# Patient Record
Sex: Female | Born: 1970
Health system: Southern US, Community
[De-identification: ages and names within clinical notes are randomized; demographics above are authoritative.]

## PROBLEM LIST (undated history)

## (undated) DIAGNOSIS — E079 Disorder of thyroid, unspecified: Secondary | ICD-10-CM

## (undated) DIAGNOSIS — O24919 Unspecified diabetes mellitus in pregnancy, unspecified trimester: Secondary | ICD-10-CM

## (undated) DIAGNOSIS — IMO0002 Reserved for concepts with insufficient information to code with codable children: Secondary | ICD-10-CM

## (undated) DIAGNOSIS — E781 Pure hyperglyceridemia: Secondary | ICD-10-CM

## (undated) DIAGNOSIS — Z8742 Personal history of other diseases of the female genital tract: Secondary | ICD-10-CM

## (undated) DIAGNOSIS — E039 Hypothyroidism, unspecified: Secondary | ICD-10-CM

## (undated) DIAGNOSIS — Z794 Long term (current) use of insulin: Secondary | ICD-10-CM

## (undated) HISTORY — DX: Pure hyperglyceridemia: E78.1

## (undated) HISTORY — DX: Disorder of thyroid, unspecified: E07.9

---

## 2010-03-08 ENCOUNTER — Ambulatory Visit (HOSPITAL_COMMUNITY): Admission: RE | Admit: 2010-03-08 | Discharge: 2010-03-08 | Payer: Self-pay | Admitting: Obstetrics & Gynecology

## 2010-12-24 ENCOUNTER — Encounter: Payer: Self-pay | Admitting: Obstetrics

## 2011-12-04 NOTE — L&D Delivery Note (Signed)
Cesarean Section Procedure Note  Indications: previous uterine incision kerr x2  Pre-operative Diagnosis: 39 week 1 day pregnancy.  Post-operative Diagnosis: same  Surgeon: Genia Del   Assistants: Arlan Organ  Anesthesia: Spinal anesthesia  ASA Class: 1   Procedure Details   The patient was seen in the Holding Room. The risks, benefits, complications, treatment options, and expected outcomes were discussed with the patient.  The patient concurred with the proposed plan, giving informed consent.  The site of surgery properly noted/marked. The patient was taken to Operating Room # 1, identified as Mary Logan and the procedure verified as C-Section Delivery. A Time Out was held and the above information confirmed.  After induction of anesthesia, the patient was draped and prepped in the usual sterile manner. A Pfannenstiel incision was made and carried down through the subcutaneous tissue to the fascia. Fascial incision was made and extended transversely. The fascia was separated from the underlying rectus tissue superiorly and inferiorly. The peritoneum was identified and entered. Peritoneal incision was extended longitudinally. The utero-vesical peritoneal reflection was incised transversely and the bladder flap was bluntly freed from the lower uterine segment. A low transverse uterine incision was made. Delivered from cephalic presentation was a Female with Apgar scores of 9 at one minute and 9 at five minutes. After the umbilical cord was clamped and cut cord blood was obtained for evaluation. The placenta was removed intact and appeared normal. The uterine outline, tubes and ovaries appeared normal. The uterine incision was closed with running locked sutures of Vicryl-0, a second mattress suture was done with Vicryl-0.  Hemostasis was completed at the left angle with a figure of eight Vicryl-0.  Hemostasis was observed.  The parietal peritoneum was closed with a Vicryl 2-0.  The  fascia was then reapproximated with running sutures of Vicryl-0.  The adipose tissue was closed with a plain.  The skin was reapproximated with Staples.  Instrument, sponge, and needle counts were correct prior the abdominal closure and at the conclusion of the case.  Estimated Blood Loss:  500 cc           Specimens: Placenta         Complications:  None; patient tolerated the procedure well.         Disposition: PACU - hemodynamically stable.         Condition: stable  Attending Attestation: I was present and scrubbed for the entire procedure.   Genia Del MD  06/23/2012 at 7:16 pm

## 2011-12-17 ENCOUNTER — Encounter: Payer: BC Managed Care – PPO | Attending: Endocrinology | Admitting: Dietician

## 2011-12-17 ENCOUNTER — Encounter: Payer: Self-pay | Admitting: Dietician

## 2011-12-17 DIAGNOSIS — E119 Type 2 diabetes mellitus without complications: Secondary | ICD-10-CM | POA: Insufficient documentation

## 2011-12-17 DIAGNOSIS — Z713 Dietary counseling and surveillance: Secondary | ICD-10-CM | POA: Insufficient documentation

## 2011-12-17 NOTE — Progress Notes (Signed)
  Medical Nutrition Therapy:  Appt start time: 1630 end time:  1730.  Assessment:  Primary concerns today: Elevated blood glucose levels and pregnancy.  Currently [redacted] weeks pregnant and having hyperglycemia.  In addition, during pregnancy with the high glucose levels, she has a history of having hypertriglyceridemia at levels of 11,000-12,000 mg.  The glucose and triglycerides decrease to normal ranges in the non-pregnant state.  She has experienced this with two previous pregnancies.   She currently is starting to monitor.  She has a One Touch Mini meter that she had used all the test strips and had only obtained one blood glucose reading.  I provided her with a One Touch Ultra Kit, oriented her to the meter and had her do a return demonstration.  Her pre-meal glucose at 5:20 PM was 70 mg/dl.  She is to test the fasting and 1 hour after the meal for Dr. Talmage Nap and aim for a 1 hour blood glucose of 120 mg/dl or less.  The fasting is to be 90 mg/dl -60 mg/dl.   We reviewed carbohydrate counting.  I provided Estate manager/land agent and a mock menu with my GDM diet.  I recommended that she keep the meal carbohydrate level to 30 gm and the snack carbohydrate level at 15 gm.  She complains that her levels are running too high and that she most likely needs insulin to help get the glucose levels down.  I advised her we need numbers to base an insulin dose on.  MEDICATIONS: Metformin, Levothyroxine, Lovasa , prenatal vitamin  DIETARY INTAKE: On her 24 hour recall, she is attempting to keep her carbohydrate intake to 15 gm for each meal, and for snack will sometimes take a cup of milk which is 12 gm of carbohydrate.  This is limiting her calcium intake and even with the increased intake of protein, she is taking very little fruit if any, and this may lead to decreased potassium and other minerals.  Recent physical activity: Trying to walk daily for 20-30 minutes. Encouraged her to work-up to 30 minutes daily.  Estimated  energy needs:Would like to see her to be able to do 30 gm of CHO for meals and 15 gm for 3 snacks each day.  This would be approximately 1300 calories per day.  Progress Towards Goal(s):  In progress.   Nutritional Diagnosis:  [place]-2.1 Inpaired nutrition utilization As related to glucose and carbohydrate.  As evidenced by increased blood glucose levels with minimal amount of CHO intake and development of high lipid levels during pregnancy.    Intervention:  Nutrition Continue the current eating pattern and check your blood glucose levels.  Try the 30 gm of carb at the lunch and dinner meals.  Work with Dr. Talmage Nap regarding carb levels and the need for medications.  Handouts given during visit include:  Nutrition, Diabetes, and Pregnancy  Novo Nordisk Carbohydrate Counting Guide  Monitoring/Evaluation:  Dietary intake, exercise, blood glucose levels, and body weight in 4-6 weeks and is to call with questions or e-mail at anytime.

## 2011-12-19 ENCOUNTER — Other Ambulatory Visit (HOSPITAL_COMMUNITY): Payer: Self-pay | Admitting: Obstetrics & Gynecology

## 2011-12-19 DIAGNOSIS — Z3689 Encounter for other specified antenatal screening: Secondary | ICD-10-CM

## 2011-12-19 DIAGNOSIS — O24919 Unspecified diabetes mellitus in pregnancy, unspecified trimester: Secondary | ICD-10-CM

## 2011-12-19 DIAGNOSIS — O09529 Supervision of elderly multigravida, unspecified trimester: Secondary | ICD-10-CM

## 2011-12-19 LAB — OB RESULTS CONSOLE ABO/RH: RH Type: POSITIVE

## 2011-12-19 LAB — OB RESULTS CONSOLE HIV ANTIBODY (ROUTINE TESTING): HIV: NONREACTIVE

## 2011-12-19 LAB — OB RESULTS CONSOLE ANTIBODY SCREEN: Antibody Screen: NEGATIVE

## 2011-12-19 LAB — OB RESULTS CONSOLE HEPATITIS B SURFACE ANTIGEN: Hepatitis B Surface Ag: NEGATIVE

## 2011-12-19 LAB — OB RESULTS CONSOLE RUBELLA ANTIBODY, IGM: Rubella: IMMUNE

## 2012-01-25 ENCOUNTER — Encounter (HOSPITAL_COMMUNITY): Payer: BC Managed Care – PPO

## 2012-01-25 ENCOUNTER — Ambulatory Visit (HOSPITAL_COMMUNITY)
Admission: RE | Admit: 2012-01-25 | Discharge: 2012-01-25 | Disposition: A | Discharge status: Home / Self Care | Payer: BC Managed Care – PPO | Source: Ambulatory Visit | Attending: Obstetrics & Gynecology | Admitting: Obstetrics & Gynecology

## 2012-01-25 ENCOUNTER — Other Ambulatory Visit (HOSPITAL_COMMUNITY): Payer: BC Managed Care – PPO

## 2012-01-25 ENCOUNTER — Encounter (HOSPITAL_COMMUNITY): Payer: Self-pay

## 2012-01-25 DIAGNOSIS — O09529 Supervision of elderly multigravida, unspecified trimester: Secondary | ICD-10-CM | POA: Insufficient documentation

## 2012-01-25 DIAGNOSIS — Z3689 Encounter for other specified antenatal screening: Secondary | ICD-10-CM

## 2012-01-25 DIAGNOSIS — O358XX0 Maternal care for other (suspected) fetal abnormality and damage, not applicable or unspecified: Secondary | ICD-10-CM | POA: Insufficient documentation

## 2012-01-25 DIAGNOSIS — O09299 Supervision of pregnancy with other poor reproductive or obstetric history, unspecified trimester: Secondary | ICD-10-CM | POA: Insufficient documentation

## 2012-01-25 DIAGNOSIS — E079 Disorder of thyroid, unspecified: Secondary | ICD-10-CM | POA: Insufficient documentation

## 2012-01-25 DIAGNOSIS — O34219 Maternal care for unspecified type scar from previous cesarean delivery: Secondary | ICD-10-CM | POA: Insufficient documentation

## 2012-01-25 DIAGNOSIS — O24919 Unspecified diabetes mellitus in pregnancy, unspecified trimester: Secondary | ICD-10-CM

## 2012-01-25 DIAGNOSIS — Z8751 Personal history of pre-term labor: Secondary | ICD-10-CM | POA: Insufficient documentation

## 2012-01-25 NOTE — Progress Notes (Signed)
Mary Logan was seen for ultrasound appointment today.  Please see [facility]OB[facility] report for details.

## 2012-06-19 ENCOUNTER — Encounter (HOSPITAL_COMMUNITY): Payer: Self-pay | Admitting: Pharmacist

## 2012-06-20 ENCOUNTER — Encounter (HOSPITAL_COMMUNITY): Payer: Self-pay

## 2012-06-20 ENCOUNTER — Encounter (HOSPITAL_COMMUNITY)
Admission: RE | Admit: 2012-06-20 | Discharge: 2012-06-20 | Disposition: A | Discharge status: Home / Self Care | Payer: BC Managed Care – PPO | Source: Ambulatory Visit | Attending: Obstetrics & Gynecology | Admitting: Obstetrics & Gynecology

## 2012-06-20 HISTORY — DX: Hypothyroidism, unspecified: E03.9

## 2012-06-20 HISTORY — DX: Personal history of other diseases of the female genital tract: Z87.42

## 2012-06-20 LAB — TYPE AND SCREEN: ABO/RH(D): O POS

## 2012-06-20 LAB — CBC
HCT: 34 % — ABNORMAL LOW (ref 36.0–46.0)
MCH: 30.8 pg (ref 26.0–34.0)
MCHC: 34.4 g/dL (ref 30.0–36.0)
MCV: 89.5 fL (ref 78.0–100.0)
Platelets: 228 10*3/uL (ref 150–400)
RDW: 13.7 % (ref 11.5–15.5)
WBC: 6 10*3/uL (ref 4.0–10.5)

## 2012-06-20 LAB — SURGICAL PCR SCREEN: Staphylococcus aureus: POSITIVE — AB

## 2012-06-20 LAB — BASIC METABOLIC PANEL
Calcium: 9.4 mg/dL (ref 8.4–10.5)
Chloride: 101 mEq/L (ref 96–112)
Creatinine, Ser: 0.69 mg/dL (ref 0.50–1.10)
GFR calc Af Amer: >90 mL/min (ref >90)
Sodium: 131 mEq/L — ABNORMAL LOW (ref 135–145)

## 2012-06-20 LAB — ABO/RH: ABO/RH(D): O POS

## 2012-06-20 NOTE — Patient Instructions (Addendum)
   Your procedure is scheduled on:  Monday, July 22  Enter through the [employer] of [facility] at:  3 PM Pick up the phone at the desk and dial [phone] and inform us of your arrival.  Please call this number if you have any problems the morning of surgery: [phone]  Remember: Do not eat food after midnight: Sunday Do not drink clear liquids after: 1:30 PM Monday Take these medicines the morning of surgery with a SIP OF WATER:  Per Dr Cristela Blue, patient instructed to withhold  Sunday nigh Metformin dose,  Insulin on Sunday - take as you normally would (20-20-22 units), Monday morning, take 10 units of Humalog.  Then at 12 noon on Monday, patient to check her sugar level at home, if CBG is below 250 - no insulin to be given but if CBG is greater than 250 - patient to administer 10 units of Humalog.  Patient may also take prenatal vit and synthroid on Monday morning.  Do not wear jewelry, make-up, or FINGER nail polish No metal in your hair or on your body. Do not wear lotions, powders, perfumes or deodorant. Do not shave 48 hours prior to surgery. Do not bring valuables to the hospital. Contacts, dentures or bridgework may not be worn into surgery.  Leave suitcase in the car. After Surgery it may be brought to your room. For patients being admitted to the hospital, checkout time is 11:00am the day of discharge. Home with Husband Reza  cell [phone].  Patients discharged on the day of surgery will not be allowed to drive home.     Remember to use your hibiclens as instructed.Please shower with 1/2 bottle the evening before your surgery and the other 1/2 bottle the morning of surgery. Neck down avoiding private area.

## 2012-06-23 ENCOUNTER — Encounter (HOSPITAL_COMMUNITY): Payer: Self-pay | Admitting: *Deleted

## 2012-06-23 ENCOUNTER — Encounter (HOSPITAL_COMMUNITY): Payer: Self-pay

## 2012-06-23 ENCOUNTER — Inpatient Hospital Stay (HOSPITAL_COMMUNITY)
Admission: RE | Admit: 2012-06-23 | Discharge: 2012-06-26 | DRG: 370 | Disposition: A | Discharge status: Home / Self Care | Payer: BC Managed Care – PPO | Source: Ambulatory Visit | Attending: Obstetrics & Gynecology | Admitting: Obstetrics & Gynecology

## 2012-06-23 ENCOUNTER — Encounter (HOSPITAL_COMMUNITY)
Admission: RE | Disposition: A | Discharge status: Home / Self Care | Payer: Self-pay | Source: Ambulatory Visit | Attending: Obstetrics & Gynecology

## 2012-06-23 ENCOUNTER — Inpatient Hospital Stay (HOSPITAL_COMMUNITY): Payer: BC Managed Care – PPO

## 2012-06-23 ENCOUNTER — Other Ambulatory Visit: Payer: Self-pay | Admitting: Obstetrics & Gynecology

## 2012-06-23 DIAGNOSIS — Z01812 Encounter for preprocedural laboratory examination: Secondary | ICD-10-CM

## 2012-06-23 DIAGNOSIS — O99814 Abnormal glucose complicating childbirth: Secondary | ICD-10-CM | POA: Diagnosis present

## 2012-06-23 DIAGNOSIS — E039 Hypothyroidism, unspecified: Secondary | ICD-10-CM | POA: Diagnosis present

## 2012-06-23 DIAGNOSIS — Z01818 Encounter for other preprocedural examination: Secondary | ICD-10-CM

## 2012-06-23 DIAGNOSIS — O34219 Maternal care for unspecified type scar from previous cesarean delivery: Principal | ICD-10-CM | POA: Diagnosis present

## 2012-06-23 DIAGNOSIS — D649 Anemia, unspecified: Secondary | ICD-10-CM | POA: Diagnosis not present

## 2012-06-23 DIAGNOSIS — E079 Disorder of thyroid, unspecified: Secondary | ICD-10-CM | POA: Diagnosis present

## 2012-06-23 DIAGNOSIS — IMO0002 Reserved for concepts with insufficient information to code with codable children: Secondary | ICD-10-CM

## 2012-06-23 DIAGNOSIS — IMO0001 Reserved for inherently not codable concepts without codable children: Secondary | ICD-10-CM

## 2012-06-23 DIAGNOSIS — O09529 Supervision of elderly multigravida, unspecified trimester: Secondary | ICD-10-CM | POA: Diagnosis present

## 2012-06-23 DIAGNOSIS — O9903 Anemia complicating the puerperium: Secondary | ICD-10-CM | POA: Diagnosis not present

## 2012-06-23 HISTORY — DX: Unspecified diabetes mellitus in pregnancy, unspecified trimester: O24.919

## 2012-06-23 HISTORY — DX: Long term (current) use of insulin: Z79.4

## 2012-06-23 HISTORY — DX: Reserved for concepts with insufficient information to code with codable children: IMO0002

## 2012-06-23 LAB — TYPE AND SCREEN: Antibody Screen: NEGATIVE

## 2012-06-23 LAB — CBC
HCT: 32 % — ABNORMAL LOW (ref 36.0–46.0)
MCH: 30.9 pg (ref 26.0–34.0)
MCV: 89.9 fL (ref 78.0–100.0)
Platelets: 216 10*3/uL (ref 150–400)
RBC: 3.56 MIL/uL — ABNORMAL LOW (ref 3.87–5.11)
RDW: 13.5 % (ref 11.5–15.5)
WBC: 5.5 10*3/uL (ref 4.0–10.5)

## 2012-06-23 LAB — GLUCOSE, CAPILLARY: Glucose-Capillary: 73 mg/dL (ref 70–99)

## 2012-06-23 SURGERY — Surgical Case
Anesthesia: Spinal | Site: Abdomen | Wound class: Clean Contaminated

## 2012-06-23 MED ORDER — KETOROLAC TROMETHAMINE 60 MG/2ML IM SOLN
INTRAMUSCULAR | Status: AC
  Administered 2012-06-23: 60 mg via INTRAMUSCULAR
  Filled 2012-06-23: qty 2

## 2012-06-23 MED ORDER — SIMETHICONE 80 MG PO CHEW: 80.0000 mg | CHEWABLE_TABLET | ORAL | Status: DC | PRN

## 2012-06-23 MED ORDER — DIPHENHYDRAMINE HCL 25 MG PO CAPS: 25.0000 mg | ORAL_CAPSULE | Freq: Four times a day (QID) | ORAL | Status: DC | PRN

## 2012-06-23 MED ORDER — LANOLIN HYDROUS EX OINT: 1.0000 "application " | TOPICAL_OINTMENT | CUTANEOUS | Status: DC | PRN

## 2012-06-23 MED ORDER — ONDANSETRON HCL 4 MG/2ML IJ SOLN
INTRAMUSCULAR | Status: AC
  Filled 2012-06-23: qty 2

## 2012-06-23 MED ORDER — PHENYLEPHRINE 40 MCG/ML (10ML) SYRINGE FOR IV PUSH (FOR BLOOD PRESSURE SUPPORT)
PREFILLED_SYRINGE | INTRAVENOUS | Status: AC
  Filled 2012-06-23: qty 10

## 2012-06-23 MED ORDER — SCOPOLAMINE 1 MG/3DAYS TD PT72: 1.0000 | MEDICATED_PATCH | Freq: Once | TRANSDERMAL | Status: DC

## 2012-06-23 MED ORDER — ONDANSETRON HCL 4 MG PO TABS: 4.0000 mg | ORAL_TABLET | ORAL | Status: DC | PRN

## 2012-06-23 MED ORDER — OXYTOCIN 10 UNIT/ML IJ SOLN
INTRAMUSCULAR | Status: AC
  Filled 2012-06-23: qty 4

## 2012-06-23 MED ORDER — SENNOSIDES-DOCUSATE SODIUM 8.6-50 MG PO TABS
2.0000 | ORAL_TABLET | Freq: Every day | ORAL | Status: DC
  Administered 2012-06-24 – 2012-06-25 (×2): 2 via ORAL

## 2012-06-23 MED ORDER — SCOPOLAMINE 1 MG/3DAYS TD PT72
MEDICATED_PATCH | TRANSDERMAL | Status: AC
  Administered 2012-06-23: 1.5 mg via TRANSDERMAL
  Filled 2012-06-23: qty 1

## 2012-06-23 MED ORDER — FENTANYL CITRATE 0.05 MG/ML IJ SOLN: 25.0000 ug | INTRAMUSCULAR | Status: DC | PRN

## 2012-06-23 MED ORDER — LEVOTHYROXINE SODIUM 112 MCG PO TABS
112.0000 ug | ORAL_TABLET | Freq: Every day | ORAL | Status: DC
  Administered 2012-06-24 – 2012-06-26 (×3): 112 ug via ORAL
  Filled 2012-06-23 (×3): qty 1

## 2012-06-23 MED ORDER — KETOROLAC TROMETHAMINE 30 MG/ML IJ SOLN: 30.0000 mg | Freq: Four times a day (QID) | INTRAMUSCULAR | Status: AC | PRN

## 2012-06-23 MED ORDER — ONDANSETRON HCL 4 MG/2ML IJ SOLN: 4.0000 mg | Freq: Three times a day (TID) | INTRAMUSCULAR | Status: DC | PRN

## 2012-06-23 MED ORDER — OXYCODONE-ACETAMINOPHEN 5-325 MG PO TABS
1.0000 | ORAL_TABLET | ORAL | Status: DC | PRN
  Administered 2012-06-25 – 2012-06-26 (×3): 1 via ORAL
  Filled 2012-06-23 (×3): qty 1

## 2012-06-23 MED ORDER — OXYTOCIN 40 UNITS IN LACTATED RINGERS INFUSION - SIMPLE MED: 62.5000 mL/h | INTRAVENOUS | Status: AC

## 2012-06-23 MED ORDER — IBUPROFEN 600 MG PO TABS
600.0000 mg | ORAL_TABLET | Freq: Four times a day (QID) | ORAL | Status: DC
  Administered 2012-06-24 – 2012-06-26 (×9): 600 mg via ORAL
  Filled 2012-06-23 (×9): qty 1

## 2012-06-23 MED ORDER — BUPIVACAINE HCL (PF) 0.25 % IJ SOLN
INTRAMUSCULAR | Status: DC | PRN
  Administered 2012-06-23: 30 mL

## 2012-06-23 MED ORDER — IBUPROFEN 600 MG PO TABS: 600.0000 mg | ORAL_TABLET | Freq: Four times a day (QID) | ORAL | Status: DC | PRN

## 2012-06-23 MED ORDER — SODIUM CHLORIDE 0.9 % IV SOLN: 1.0000 ug/kg/h | INTRAVENOUS | Status: DC | PRN

## 2012-06-23 MED ORDER — ONDANSETRON HCL 4 MG/2ML IJ SOLN
INTRAMUSCULAR | Status: DC | PRN
  Administered 2012-06-23: 4 mg via INTRAVENOUS

## 2012-06-23 MED ORDER — NALOXONE HCL 0.4 MG/ML IJ SOLN: 0.4000 mg | INTRAMUSCULAR | Status: DC | PRN

## 2012-06-23 MED ORDER — PHENYLEPHRINE HCL 10 MG/ML IJ SOLN
INTRAMUSCULAR | Status: DC | PRN
  Administered 2012-06-23 (×2): 80 ug via INTRAVENOUS
  Administered 2012-06-23 (×2): 40 ug via INTRAVENOUS

## 2012-06-23 MED ORDER — KETOROLAC TROMETHAMINE 30 MG/ML IJ SOLN
30.0000 mg | Freq: Four times a day (QID) | INTRAMUSCULAR | Status: AC | PRN
  Filled 2012-06-23: qty 1

## 2012-06-23 MED ORDER — DEXTROSE 5 % IV SOLN
2.0000 g | Freq: Once | INTRAVENOUS | Status: AC
  Administered 2012-06-23: 2 g via INTRAVENOUS
  Filled 2012-06-23: qty 2

## 2012-06-23 MED ORDER — ZOLPIDEM TARTRATE 5 MG PO TABS: 5.0000 mg | ORAL_TABLET | Freq: Every evening | ORAL | Status: DC | PRN

## 2012-06-23 MED ORDER — OMEGA-3-ACID ETHYL ESTERS 1 G PO CAPS
3.0000 g | ORAL_CAPSULE | Freq: Two times a day (BID) | ORAL | Status: DC
  Administered 2012-06-24: 3 g via ORAL
  Administered 2012-06-25 – 2012-06-26 (×3): 1 g via ORAL
  Filled 2012-06-23 (×6): qty 3

## 2012-06-23 MED ORDER — INSULIN ASPART 100 UNIT/ML ~~LOC~~ SOLN
11.0000 [IU] | Freq: Every day | SUBCUTANEOUS | Status: DC
  Administered 2012-06-24 – 2012-06-25 (×2): 11 [IU] via SUBCUTANEOUS

## 2012-06-23 MED ORDER — SODIUM CHLORIDE 0.9 % IJ SOLN: 3.0000 mL | INTRAMUSCULAR | Status: DC | PRN

## 2012-06-23 MED ORDER — BUPIVACAINE IN DEXTROSE 0.75-8.25 % IT SOLN
INTRATHECAL | Status: DC | PRN
  Administered 2012-06-23: 1.3 mL via INTRATHECAL

## 2012-06-23 MED ORDER — LACTATED RINGERS IV SOLN
INTRAVENOUS | Status: DC
  Administered 2012-06-23 (×2): via INTRAVENOUS
  Administered 2012-06-23: 125 mL/h via INTRAVENOUS
  Administered 2012-06-23: 18:00:00 via INTRAVENOUS

## 2012-06-23 MED ORDER — MENTHOL 3 MG MT LOZG: 1.0000 | LOZENGE | OROMUCOSAL | Status: DC | PRN

## 2012-06-23 MED ORDER — OXYTOCIN 10 UNIT/ML IJ SOLN
40.0000 [IU] | INTRAVENOUS | Status: DC | PRN
  Administered 2012-06-23: 40 [IU] via INTRAVENOUS

## 2012-06-23 MED ORDER — LACTATED RINGERS IV SOLN
INTRAVENOUS | Status: DC
  Administered 2012-06-24: 06:00:00 via INTRAVENOUS

## 2012-06-23 MED ORDER — MUPIROCIN 2 % EX OINT
TOPICAL_OINTMENT | CUTANEOUS | Status: AC
  Filled 2012-06-23: qty 22

## 2012-06-23 MED ORDER — FENTANYL CITRATE 0.05 MG/ML IJ SOLN
INTRAMUSCULAR | Status: DC | PRN
  Administered 2012-06-23: 15 ug via INTRATHECAL

## 2012-06-23 MED ORDER — BUPIVACAINE HCL (PF) 0.25 % IJ SOLN
INTRAMUSCULAR | Status: AC
  Filled 2012-06-23: qty 30

## 2012-06-23 MED ORDER — MORPHINE SULFATE 0.5 MG/ML IJ SOLN
INTRAMUSCULAR | Status: AC
  Filled 2012-06-23: qty 10

## 2012-06-23 MED ORDER — DIPHENHYDRAMINE HCL 25 MG PO CAPS
25.0000 mg | ORAL_CAPSULE | ORAL | Status: DC | PRN
Start: 2012-06-23 — End: 2012-06-26

## 2012-06-23 MED ORDER — MAGNESIUM HYDROXIDE 400 MG/5ML PO SUSP: 30.0000 mL | ORAL | Status: DC | PRN

## 2012-06-23 MED ORDER — DIBUCAINE 1 % RE OINT: 1.0000 "application " | TOPICAL_OINTMENT | RECTAL | Status: DC | PRN

## 2012-06-23 MED ORDER — DIPHENHYDRAMINE HCL 50 MG/ML IJ SOLN: 25.0000 mg | INTRAMUSCULAR | Status: DC | PRN

## 2012-06-23 MED ORDER — MORPHINE SULFATE (PF) 0.5 MG/ML IJ SOLN
INTRAMUSCULAR | Status: DC | PRN
  Administered 2012-06-23: .1 mg via INTRATHECAL

## 2012-06-23 MED ORDER — FERROUS SULFATE 325 (65 FE) MG PO TABS
325.0000 mg | ORAL_TABLET | Freq: Two times a day (BID) | ORAL | Status: DC
  Administered 2012-06-24 – 2012-06-26 (×5): 325 mg via ORAL
  Filled 2012-06-23 (×5): qty 1

## 2012-06-23 MED ORDER — FENTANYL CITRATE 0.05 MG/ML IJ SOLN
INTRAMUSCULAR | Status: AC
  Filled 2012-06-23: qty 2

## 2012-06-23 MED ORDER — NALBUPHINE HCL 10 MG/ML IJ SOLN
5.0000 mg | INTRAMUSCULAR | Status: DC | PRN
  Administered 2012-06-24: 5 mg via INTRAVENOUS
  Filled 2012-06-23: qty 1

## 2012-06-23 MED ORDER — WITCH HAZEL-GLYCERIN EX PADS: 1.0000 "application " | MEDICATED_PAD | CUTANEOUS | Status: DC | PRN

## 2012-06-23 MED ORDER — MEPERIDINE HCL 25 MG/ML IJ SOLN: 6.2500 mg | INTRAMUSCULAR | Status: DC | PRN

## 2012-06-23 MED ORDER — METOCLOPRAMIDE HCL 5 MG/ML IJ SOLN: 10.0000 mg | Freq: Three times a day (TID) | INTRAMUSCULAR | Status: DC | PRN

## 2012-06-23 MED ORDER — INSULIN ASPART 100 UNIT/ML ~~LOC~~ SOLN
10.0000 [IU] | Freq: Every day | SUBCUTANEOUS | Status: DC
  Administered 2012-06-24 – 2012-06-25 (×2): 10 [IU] via SUBCUTANEOUS

## 2012-06-23 MED ORDER — PRENATAL MULTIVITAMIN CH
1.0000 | ORAL_TABLET | Freq: Every day | ORAL | Status: DC
  Administered 2012-06-24 – 2012-06-26 (×3): 1 via ORAL
  Filled 2012-06-23 (×3): qty 1

## 2012-06-23 MED ORDER — KETOROLAC TROMETHAMINE 60 MG/2ML IM SOLN
60.0000 mg | Freq: Once | INTRAMUSCULAR | Status: AC | PRN
  Administered 2012-06-23: 60 mg via INTRAMUSCULAR

## 2012-06-23 MED ORDER — TETANUS-DIPHTH-ACELL PERTUSSIS 5-2.5-18.5 LF-MCG/0.5 IM SUSP: 0.5000 mL | Freq: Once | INTRAMUSCULAR | Status: DC

## 2012-06-23 MED ORDER — NALBUPHINE HCL 10 MG/ML IJ SOLN
5.0000 mg | INTRAMUSCULAR | Status: DC | PRN
  Administered 2012-06-24: 5 mg via SUBCUTANEOUS
  Filled 2012-06-23 (×2): qty 1

## 2012-06-23 MED ORDER — DIPHENHYDRAMINE HCL 50 MG/ML IJ SOLN: 12.5000 mg | INTRAMUSCULAR | Status: DC | PRN

## 2012-06-23 MED ORDER — SCOPOLAMINE 1 MG/3DAYS TD PT72
1.0000 | MEDICATED_PATCH | Freq: Once | TRANSDERMAL | Status: DC
  Administered 2012-06-23: 1.5 mg via TRANSDERMAL

## 2012-06-23 MED ORDER — ONDANSETRON HCL 4 MG/2ML IJ SOLN: 4.0000 mg | INTRAMUSCULAR | Status: DC | PRN

## 2012-06-23 MED ORDER — INSULIN ASPART 100 UNIT/ML ~~LOC~~ SOLN
10.0000 [IU] | Freq: Every day | SUBCUTANEOUS | Status: DC
  Administered 2012-06-24 – 2012-06-26 (×3): 10 [IU] via SUBCUTANEOUS

## 2012-06-23 MED ORDER — SIMETHICONE 80 MG PO CHEW
80.0000 mg | CHEWABLE_TABLET | Freq: Three times a day (TID) | ORAL | Status: DC
  Administered 2012-06-24 – 2012-06-26 (×8): 80 mg via ORAL

## 2012-06-23 SURGICAL SUPPLY — 33 items
CLOTH BEACON ORANGE TIMEOUT ST (SAFETY) ×2 IMPLANT
CONTAINER PREFILL 10% NBF 15ML (MISCELLANEOUS) IMPLANT
DRSG COVADERM 4X10 (GAUZE/BANDAGES/DRESSINGS) ×2 IMPLANT
ELECT REM PT RETURN 9FT ADLT (ELECTROSURGICAL) ×2
ELECTRODE REM PT RTRN 9FT ADLT (ELECTROSURGICAL) ×1 IMPLANT
EXTRACTOR VACUUM M CUP 4 TUBE (SUCTIONS) IMPLANT
GLOVE BIO SURGEON STRL SZ 6.5 (GLOVE) ×4 IMPLANT
GLOVE BIOGEL PI IND STRL 7.0 (GLOVE) ×1 IMPLANT
GLOVE BIOGEL PI INDICATOR 7.0 (GLOVE) ×1
GOWN PREVENTION PLUS LG XLONG (DISPOSABLE) ×6 IMPLANT
KIT ABG SYR 3ML LUER SLIP (SYRINGE) IMPLANT
NEEDLE HYPO 22GX1.5 SAFETY (NEEDLE) ×2 IMPLANT
NEEDLE HYPO 25X1 1.5 SAFETY (NEEDLE) ×2 IMPLANT
NEEDLE HYPO 25X5/8 SAFETYGLIDE (NEEDLE) ×2 IMPLANT
PACK C SECTION WH (CUSTOM PROCEDURE TRAY) ×2 IMPLANT
PAD ABD 7.5X8 STRL (GAUZE/BANDAGES/DRESSINGS) ×2 IMPLANT
RTRCTR C-SECT PINK 25CM LRG (MISCELLANEOUS) ×2 IMPLANT
SLEEVE SCD COMPRESS KNEE MED (MISCELLANEOUS) ×2 IMPLANT
STAPLER VISISTAT 35W (STAPLE) IMPLANT
SUT PLAIN 0 NONE (SUTURE) IMPLANT
SUT VIC AB 0 CT1 27 (SUTURE) ×2
SUT VIC AB 0 CT1 27XBRD ANBCTR (SUTURE) ×2 IMPLANT
SUT VIC AB 0 CTX 36 (SUTURE) ×2
SUT VIC AB 0 CTX36XBRD ANBCTRL (SUTURE) ×2 IMPLANT
SUT VIC AB 2-0 CT1 27 (SUTURE) ×1
SUT VIC AB 2-0 CT1 TAPERPNT 27 (SUTURE) ×1 IMPLANT
SUT VIC AB 3-0 SH 27 (SUTURE)
SUT VIC AB 3-0 SH 27X BRD (SUTURE) IMPLANT
SYR CONTROL 10ML LL (SYRINGE) ×4 IMPLANT
TAPE CLOTH SURG 4X10 WHT LF (GAUZE/BANDAGES/DRESSINGS) ×2 IMPLANT
TOWEL OR 17X24 6PK STRL BLUE (TOWEL DISPOSABLE) ×4 IMPLANT
TRAY FOLEY CATH 14FR (SET/KITS/TRAYS/PACK) ×2 IMPLANT
WATER STERILE IRR 1000ML POUR (IV SOLUTION) IMPLANT

## 2012-06-23 NOTE — Anesthesia Preprocedure Evaluation (Signed)
Anesthesia Evaluation  Patient identified by MRN, date of birth, ID band Patient awake    Reviewed: Allergy & Precautions, H&P , NPO status , Patient's Chart, lab work & pertinent test results, reviewed documented beta blocker date and time   History of Anesthesia Complications Negative for: history of anesthetic complications  Airway Mallampati: III TM Distance: >3 FB Neck ROM: full    Dental  (+) Caps and Chipped   Pulmonary neg pulmonary ROS,  breath sounds clear to auscultation        Cardiovascular negative cardio ROS  Rhythm:regular Rate:Normal     Neuro/Psych negative neurological ROS  negative psych ROS   GI/Hepatic negative GI ROS, Neg liver ROS,   Endo/Other  Gestational, Insulin DependentHypothyroidism PCOS  Renal/GU negative Renal ROS  negative genitourinary   Musculoskeletal   Abdominal   Peds  Hematology negative hematology ROS (+)   Anesthesia Other Findings   Reproductive/Obstetrics (+) Pregnancy (h/o prior c/s x2)                           Anesthesia Physical Anesthesia Plan  ASA: II  Anesthesia Plan: Spinal   Post-op Pain Management:    Induction:   Airway Management Planned:   Additional Equipment:   Intra-op Plan:   Post-operative Plan:   Informed Consent: I have reviewed the patients History and Physical, chart, labs and discussed the procedure including the risks, benefits and alternatives for the proposed anesthesia with the patient or authorized representative who has indicated his/her understanding and acceptance.     Plan Discussed with: Surgeon and CRNA  Anesthesia Plan Comments:         Anesthesia Quick Evaluation

## 2012-06-23 NOTE — Anesthesia Postprocedure Evaluation (Signed)
  Anesthesia Post-op Note  Patient: Mary Logan  Procedure(s) Performed: Procedure(s) (LRB): CESAREAN SECTION (N/A)  Patient Location: PACU  Anesthesia Type: Spinal  Level of Consciousness: awake, alert  and oriented  Airway and Oxygen Therapy: Patient Spontanous Breathing  Post-op Pain: none  Post-op Assessment: Post-op Vital signs reviewed, Patient's Cardiovascular Status Stable, Respiratory Function Stable, Patent Airway, No signs of Nausea or vomiting, Pain level controlled, No headache, No backache, No residual numbness and No residual motor weakness  Post-op Vital Signs: Reviewed and stable  Complications: No apparent anesthesia complications

## 2012-06-23 NOTE — H&P (Signed)
Mary Logan is a 41 y.o. female [id number] [redacted]w[redacted]d presenting for repeat C/S.  HPP:  IDDM well controled.  AGA per [place].  OB History    Grav Para Term Preterm Abortions TAB SAB Ect Mult Living   5 2 1 1 2  2   1      Past Medical History  Diagnosis Date  . Thyroid disease   . Hypertriglyceridemia     diet controlled  . Diabetes mellitus     GDM - insulin  . Hypothyroidism   . History of PCOS     tx with metformin   Past Surgical History  Procedure Date  . Cesarean section     x 2   Family History: family history includes Diabetes in her brother and mother. Social History:  reports that she has never smoked. She has never used smokeless tobacco. She reports that she does not drink alcohol or use illicit drugs. Current facility-administered medications:bupivacaine (MARCAINE) 0.25 % injection, , , PRN, Genia Del, MD, 30 mL at 06/23/12 1725;  lactated ringers infusion, , Intravenous, Continuous, Dana Allan, MD, Last Rate: 125 mL/hr at 06/23/12 1645, 125 mL/hr at 06/23/12 1645;  mupirocin ointment (BACTROBAN) 2 %, , , , ;  scopolamine (TRANSDERM-SCOP) 1.5 MG 1.5 mg, 1 patch, Transdermal, Once, Dana Allan, MD, 1.5 mg at 06/23/12 1602 No Known Allergies    Blood pressure 127/75, pulse 89, temperature 98.2 F (36.8 C), temperature source Oral, resp. rate 18, last menstrual period 09/17/2011, SpO2 100.00%.   HPP: There is no problem list on file for this patient.   Prenatal labs: ABO, Rh: --/--/O POS (07/19 1420) Antibody: NEG (07/19 1413) Rubella:  Immune RPR: NON REACTIVE (07/19 1413)  HBsAg: Negative (01/16 0000)  HIV: Non-reactive (01/16 0000)  Genetic testing: Declined [place] anato: wnl level 2 GBS:  neg  Assessment/Plan: 39+ wks IDDM well controled.  2 previous C/S for repeat C/S.   Mary Logan,Mary Logan 06/23/2012, 5:27 PM

## 2012-06-23 NOTE — Anesthesia Procedure Notes (Signed)
Spinal  Patient location during procedure: OR Start time: 06/23/2012 6:08 PM Staffing Performed by: anesthesiologist  Preanesthetic Checklist Completed: patient identified, site marked, surgical consent, pre-op evaluation, timeout performed, IV checked, risks and benefits discussed and monitors and equipment checked Spinal Block Patient position: sitting Prep: site prepped and draped and DuraPrep Patient monitoring: blood pressure, continuous pulse ox and heart rate Approach: midline Location: L3-4 Injection technique: single-shot Needle Needle type: Sprotte  Needle gauge: 24 G Needle length: 9 cm Assessment Sensory level: T4 Additional Notes Clear free flow CSF on first attempt.  No paresthesia.  Patient tolerated procedure well.  Jasmine December, MD

## 2012-06-23 NOTE — Transfer of Care (Signed)
Immediate Anesthesia Transfer of Care Note  Patient: Mary Logan  Procedure(s) Performed: Procedure(s) (LRB): CESAREAN SECTION (N/A)  Patient Location: PACU  Anesthesia Type: Spinal  Level of Consciousness: awake, alert  and oriented  Airway & Oxygen Therapy: Patient Spontanous Breathing  Post-op Assessment: Report given to PACU RN  Post vital signs: Reviewed and stable  Complications: No apparent anesthesia complications

## 2012-06-23 NOTE — Progress Notes (Signed)
CBG 63 in SS

## 2012-06-24 ENCOUNTER — Encounter (HOSPITAL_COMMUNITY): Payer: Self-pay

## 2012-06-24 DIAGNOSIS — IMO0002 Reserved for concepts with insufficient information to code with codable children: Secondary | ICD-10-CM

## 2012-06-24 DIAGNOSIS — IMO0001 Reserved for inherently not codable concepts without codable children: Secondary | ICD-10-CM

## 2012-06-24 HISTORY — DX: Reserved for inherently not codable concepts without codable children: IMO0001

## 2012-06-24 HISTORY — DX: Reserved for concepts with insufficient information to code with codable children: IMO0002

## 2012-06-24 LAB — CBC
HCT: 26.6 % — ABNORMAL LOW (ref 36.0–46.0)
MCH: 31.1 pg (ref 26.0–34.0)
MCHC: 34.6 g/dL (ref 30.0–36.0)
MCV: 89.9 fL (ref 78.0–100.0)
RDW: 13.6 % (ref 11.5–15.5)

## 2012-06-24 MED ORDER — LACTATED RINGERS IV BOLUS (SEPSIS)
500.0000 mL | Freq: Once | INTRAVENOUS | Status: AC
  Administered 2012-06-24: 500 mL via INTRAVENOUS

## 2012-06-24 NOTE — Anesthesia Postprocedure Evaluation (Signed)
  Anesthesia Post-op Note  Patient: Mary Logan  Procedure(s) Performed: Procedure(s) (LRB): CESAREAN SECTION (N/A)  Patient Location: Mother/Baby  Anesthesia Type: Spinal  Level of Consciousness: awake, alert  and oriented  Airway and Oxygen Therapy: Patient Spontanous Breathing  Post-op Pain: none  Post-op Assessment: Post-op Vital signs reviewed and Patient's Cardiovascular Status Stable  Post-op Vital Signs: Reviewed and stable  Complications: No apparent anesthesia complications

## 2012-06-24 NOTE — Progress Notes (Signed)
Subjective: POD# 1 Information for the patient's newborn:  Nevin, Kozuch Girl Mylah [[id number]]  female   Reports feeling tired but well. Feeding: breast Patient reports tolerating PO.  Breast symptoms: none Pain controlled with Motrin. Denies HA/SOB/C/P/N/V/dizziness. Flatus small. She reports vaginal bleeding as normal, without clots.  She ambulated to BR x 1, foley cath in place.     Objective:   VS:  Filed Vitals:   06/24/12 0550 06/24/12 0630 06/24/12 0633 06/24/12 0636  BP:  99/65 109/75 118/67  Pulse:  66 81 73  Temp:  97.8 F (36.6 C)    TempSrc:  Oral    Resp: 16 18    Weight:      SpO2: 97% 98%       Intake/Output Summary (Last 24 hours) at 06/24/12 0858 Last data filed at 06/24/12 0704  Gross per 24 hour  Intake   3545 ml  Output   1350 ml  Net   2195 ml        Basename 06/24/12 0550 06/23/12 1618  WBC 8.4 5.5  HGB 9.2* 11.0*  HCT 26.6* 32.0*  PLT 167 216   CBG (last 3)   Basename 06/24/12 0820 06/23/12 1946 06/23/12 1605  GLUCAP 83 73 63*       Blood type: --/--/O POS (07/22 1618)  Rubella: Immune (01/16 0000)     Physical Exam:  General: alert, cooperative and no distress CV: Regular rate and rhythm Resp: clear Abdomen: soft, nontender, decreased bowel sounds Incision: clean, dry, intact and dressing to LST Uterine Fundus: firm, below umbilicus, nontender Lochia: minimal Ext: Homans sign is negative, no sign of DVT and no edema, redness or tenderness in the calves or thighs      Assessment/Plan: 41 y.o.   POD# 1.  s/p Cesarean Delivery.  Indications: repeat                Principal Problem:  *Postpartum care following cesarean delivery (7/22) Active Problems:  Maternal anemia complicating pregnancy, childbirth, or the puerperium  Pregnancy And Insulin-Dependent Diabetes Mellitus (delivered)  Doing well, stable post-op.    Advance diet as tolerated D/C foley  Ambulate Routine post-op care  CBG stable on half previous dose of  insulin. Plan f/u w/ endo (Dr. Talmage Nap) post-partum  Asymptomatic IDA and ABL anemia, on oral Fe             Davisha Linthicum 06/24/2012, 8:58 AM

## 2012-06-24 NOTE — Progress Notes (Signed)
MD notified of decreased urinary output.  100 ml emptied out of catheter this morning.  New orders received for a 500 ml bolus of lactated ringers

## 2012-06-24 NOTE — Addendum Note (Signed)
Addendum  created 06/24/12 0754 by Shanon Payor, CRNA   Modules edited:Notes Section

## 2012-06-25 ENCOUNTER — Encounter (HOSPITAL_COMMUNITY): Payer: Self-pay | Admitting: Obstetrics & Gynecology

## 2012-06-25 LAB — GLUCOSE, CAPILLARY
Glucose-Capillary: 82 mg/dL (ref 70–99)
Glucose-Capillary: 115 mg/dL — ABNORMAL HIGH (ref 70–99)
Glucose-Capillary: 80 mg/dL (ref 70–99)

## 2012-06-25 NOTE — Progress Notes (Signed)
Pt's HS CBG was 169- pt states recently had juice and crackers

## 2012-06-25 NOTE — Progress Notes (Signed)
Patient ID: Mary Logan, female   DOB: 04-16-1971, 41 y.o.   MRN: [id number]  POSTOPERATIVE DAY # 2 S/P cesarean section / GDM   S:         Reports feeling well - "tired"             Tolerating po intake / no nausea / no vomiting / + flatus / no BM             Bleeding is light             Pain controlled with motrin             Up ad lib / ambulatory  Newborn breast feeding     O:  A & O x 3              VS: Blood pressure 114/75, pulse 76, temperature 98.3 F (36.8 C), temperature source Oral, resp. rate 18, weight 61.236 kg (135 lb), last menstrual period 09/17/2011, SpO2 99.00%, unknown if currently breastfeeding.  LABS:   FBS 82  Lungs: Clear and unlabored  Heart: regular rate and rhythm   Abdomen: soft, non-tender, non-distended, active BS             Fundus: firm, non-tender, U-2             Dressing OFF              Incision:  approximated with staples / no erythema / no ecchymosis / no drainage  Perineum: no edema  Lochia: light  Extremities: no edema, no calf pain or tenderness, negative Homans  A:        POD # 2 S/P cesarean section            GDM-delivered  P:        Routine postoperative care              Anticipate DC tomorrow             Advance activity today     Marlinda Mike CNM, MSN 06/25/2012, 8:59 AM

## 2012-06-25 NOTE — Progress Notes (Signed)
Dr Juliene Pina notified of pt's CBG and report of having juice and crackers "5 to 10 minutes" prior to check.  No new orders.

## 2012-06-26 LAB — GLUCOSE, CAPILLARY: Glucose-Capillary: 76 mg/dL (ref 70–99)

## 2012-06-26 MED ORDER — IBUPROFEN 600 MG PO TABS: 600.0000 mg | ORAL_TABLET | Freq: Four times a day (QID) | ORAL | Status: AC

## 2012-06-26 MED ORDER — OXYCODONE-ACETAMINOPHEN 5-325 MG PO TABS: 1.0000 | ORAL_TABLET | ORAL | Status: AC | PRN

## 2012-06-26 MED ORDER — INSULIN LISPRO 100 UNIT/ML ~~LOC~~ SOLN: 11.0000 [IU] | Freq: Every day | SUBCUTANEOUS | Status: DC

## 2012-06-26 MED ORDER — POLYSACCHARIDE IRON COMPLEX 150 MG PO CAPS: 150.0000 mg | ORAL_CAPSULE | Freq: Every day | ORAL | Status: DC

## 2012-06-26 MED ORDER — INSULIN LISPRO 100 UNIT/ML ~~LOC~~ SOLN: 10.0000 [IU] | Freq: Every day | SUBCUTANEOUS | Status: DC

## 2012-06-26 MED ORDER — BREAST PUMP MISC: 1.0000 [IU] | Status: DC | PRN

## 2012-06-26 NOTE — Progress Notes (Signed)
Subjective: POD# 3 Information for the patient's newborn:  Mary Logan, Mary Logan [[id number]]  female   Reports feeling tired but well, ready for discharge. Feeding: breast Patient reports tolerating PO.  Breast symptoms: milk is coming in. Pain controlled with Motrin and Percocet. Denies HA/SOB/C/P/N/V/dizziness. Flatus present.. She reports vaginal bleeding as normal, without clots.  She is ambulating, urinating without difficult.     Objective:   VS:  Filed Vitals:   06/25/12 0523 06/25/12 1432 06/25/12 2153 06/26/12 0621  BP: 114/75 103/69 118/70 99/66  Pulse: 76 77 86 73  Temp: 98.3 F (36.8 C) 98.2 F (36.8 C) 98.4 F (36.9 C) 96.9 F (36.1 C)  TempSrc: Oral Oral Oral Axillary  Resp: 18 18 18 18   Weight:      SpO2:        No intake or output data in the 24 hours ending 06/26/12 0859      Basename 06/24/12 0550 06/23/12 1618  WBC 8.4 5.5  HGB 9.2* 11.0*  HCT 26.6* 32.0*  PLT 167 216     Blood type: --/--/O POS (07/22 1618)  Rubella: Immune (01/16 0000)     Physical Exam:  General: alert, cooperative and no distress CV: Regular rate and rhythm Resp: clear Abdomen: soft, nontender, normal bowel sounds Incision: clean, dry, intact and staples in place Uterine Fundus: firm, below umbilicus, nontender Lochia: minimal Ext: extremities normal, atraumatic, no cyanosis or edema and no edema, redness or tenderness in the calves or thighs      Assessment/Plan: 41 y.o.   POD# 3.  s/p Cesarean Delivery.  Indications: Repeat                Active Problems:  Postpartum care following cesarean delivery (7/22)  Maternal anemia complicating pregnancy, childbirth, or the puerperium  Pregnancy And Insulin-Dependent Diabetes Mellitus (delivered)  Continue oral Fe 4-6 wks PP Continue current insulin dose, patient to call if BS <70 or >160 F/U w/ endo Dr. Talmage Nap in 2 weeks  Routine post-op care Staples removal at [facility] in 4 days DC home w/ [facility]  instructions.  Mary Logan 06/26/2012, 8:59 AM

## 2012-06-26 NOTE — Plan of Care (Signed)
Problem: Discharge Progression Outcomes Goal: Remove staples per MD order Outcome: Not Applicable Date Met:  06/26/12 In office Monday

## 2012-06-26 NOTE — Discharge Summary (Signed)
POSTOPERATIVE DISCHARGE SUMMARY:  Patient ID: Mary Logan MRN: [id number] DOB/AGE: [birth year] 41 y.o.  Admit date: 06/23/2012 Discharge date:  06/26/2012   Admission Diagnoses: 1. Term pregnancy for repeat cesarean section 2. Gestational diabetes, insulin controlled 3. Hypothyroidism   Discharge Diagnoses:  1. Term Pregnancy-delivered, repeat cesarean section 2. Post-operative day 3, stable 3. Maternal anemia, stable  Prenatal history: [id number]   EDC : 06/29/2012, by Ultrasound  Prenatal care at [facility] since 1st trimester  Prenatal course complicated by gestational diabetes insulin controlled, previous cesarean section x 2, and hypothyroidism  Prenatal Labs: ABO, Rh: --/--/O POS (07/19 1420)  Antibody: NEG (07/19 1413)  Rubella: Immune  RPR: NON REACTIVE (07/19 1413)  HBsAg: Negative (01/16 0000)  HIV: Non-reactive (01/16 0000)  Genetic testing: Declined  [place] anato: wnl level 2  GBS: neg    Medical / Surgical History :  Past medical history:  Past Medical History  Diagnosis Date  . Thyroid disease   . Hypertriglyceridemia     diet controlled  . Diabetes mellitus     GDM - insulin  . Hypothyroidism   . History of PCOS     tx with metformin  . Postpartum care following cesarean delivery (7/22) 06/24/2012  . Maternal anemia complicating pregnancy, childbirth, or the puerperium 06/24/2012  . Pregnancy And Insulin-Dependent Diabetes Mellitus (delivered) 06/24/2012    Past surgical history:  Past Surgical History  Procedure Date  . Cesarean section     x 2  . Cesarean section 06/23/2012    Procedure: CESAREAN SECTION;  Surgeon: Genia Del, MD;  Location: [facility];  Service: Gynecology;  Laterality: N/A;  EDD: 06/29/12    Family History:  Family History  Problem Relation Age of Onset  . Diabetes Mother   . Diabetes Brother     Social History:  reports that she has never smoked. She has never used smokeless tobacco. She reports that she  does not drink alcohol or use illicit drugs.   Allergies: Review of patient's allergies indicates no known allergies.    Current Medications at time of admission:  Prescriptions prior to admission  Medication Sig Dispense Refill  . levothyroxine (SYNTHROID, LEVOTHROID) 112 MCG tablet Take 112 mcg by mouth daily.      Marland Kitchen omega-3 acid ethyl esters (LOVAZA) 1 G capsule Take 3 g by mouth 2 (two) times daily.      . Prenatal Vit-Fe Fumarate-FA (PRENATAL VITAMINS PLUS PO) Take 1 tablet by mouth daily.      Marland Kitchen DISCONTD: insulin lispro (HUMALOG) 100 UNIT/ML injection Inject 20 Units into the skin daily before breakfast.      . DISCONTD: insulin lispro (HUMALOG) 100 UNIT/ML injection Inject 20 Units into the skin daily before lunch.      . DISCONTD: insulin lispro (HUMALOG) 100 UNIT/ML injection Inject 22 Units into the skin daily before supper.      Marland Kitchen DISCONTD: metFORMIN (GLUCOPHAGE) 500 MG tablet Take 500 mg by mouth 2 (two) times daily with a meal.         Admit labs:  06/23/2012 16:18  WBC 5.5  RBC 3.56 (L)  Hemoglobin 11.0 (L)  HCT 32.0 (L)  MCV 89.9  MCH 30.9  MCHC 34.4  RDW 13.5  Platelets 216     Intrapartum Course:   Procedures: Cesarean section delivery of female newborn by Dr Seymour Bars  See operative report for further details  Postoperative / postpartum course: Uneventful  Physical Exam:  VSS: Blood pressure 99/66, pulse 73,  temperature 96.9 F (36.1 C), temperature source Axillary, resp. rate 18, weight 61.236 kg (135 lb), last menstrual period 09/17/2011, SpO2 99.00%, unknown if currently breastfeeding.   LABS:  Basename 06/24/12 0550 06/23/12 1618  WBC 8.4 5.5  HGB 9.2* 11.0*  HCT 26.6* 32.0*  PLT 167 216   CBG (last 3)   Basename 06/26/12 0757 06/26/12 0015 06/25/12 2003  GLUCAP 79 76 80     Incision:  approximated with staples / no erythema / no ecchymosis / no drainage Staples: for removal in office in 4 days  Discharge Instructions:  Discharged  Condition: good Activity: pelvic rest, weight lifting and driving restrictions x 2 weeks Diet: routine Medications:  Medication List  As of 06/26/2012  9:12 AM   STOP taking these medications         metFORMIN 500 MG tablet         TAKE these medications         Breast Pump Misc   1 Units by Does not apply route as needed.      ibuprofen 600 MG tablet   Commonly known as: ADVIL,MOTRIN   Take 1 tablet (600 mg total) by mouth every 6 (six) hours.      insulin lispro 100 UNIT/ML injection   Commonly known as: HUMALOG   Inject 10 Units into the skin daily before breakfast.      insulin lispro 100 UNIT/ML injection   Commonly known as: HUMALOG   Inject 10 Units into the skin daily before lunch.      insulin lispro 100 UNIT/ML injection   Commonly known as: HUMALOG   Inject 11 Units into the skin daily before supper.      iron polysaccharides 150 MG capsule   Commonly known as: NIFEREX   Take 1 capsule (150 mg total) by mouth daily.      levothyroxine 112 MCG tablet   Commonly known as: SYNTHROID, LEVOTHROID   Take 112 mcg by mouth daily.      LOVAZA 1 G capsule   Generic drug: omega-3 acid ethyl esters   Take 3 g by mouth 2 (two) times daily.      oxyCODONE-acetaminophen 5-325 MG per tablet   Commonly known as: PERCOCET/ROXICET   Take 1-2 tablets by mouth every 4 (four) hours as needed (moderate - severe pain).      PRENATAL VITAMINS PLUS PO   Take 1 tablet by mouth daily.           Condition: stable Postpartum Instructions: refer to practice specific booklet Discharge to: home Disposition: Final discharge disposition not confirmed Follow up :  Follow-up Information    Follow up with LAVOIE,MARIE-LYNE, MD. Schedule an appointment as soon as possible for a visit in 6 weeks.   Contact information:   [address] [place] [address] [phone]       Follow up with [facility]. Schedule an appointment as soon as possible for a visit in  4 days. (for staple removal)       Schedule an appointment as soon as possible for a visit with Dorisann Frames, MD. (as instructed by endocrinologist)    Contact information:   [address] [place] [address] [phone]           Signed: Arlan Organ 06/26/2012, 9:12 AM

## 2014-10-04 ENCOUNTER — Encounter (HOSPITAL_COMMUNITY): Payer: Self-pay | Admitting: Obstetrics & Gynecology

## 2016-01-24 MED FILL — METFORMIN HCL ER 500 MG TAB: 500 | 45 days supply | Qty: 180 | Fill #1

## 2016-02-22 MED FILL — LEVOTHYROXINE 125 MCG TAB: 125 | 90 days supply | Qty: 90 | Fill #2

## 2016-02-22 MED FILL — FENOFIBRATE 54 MG TABLET: 54 | 90 days supply | Qty: 90 | Fill #2

## 2016-05-28 MED FILL — LEVOTHYROXINE 125 MCG TAB: 125 | 90 days supply | Qty: 90 | Fill #3

## 2016-05-28 MED FILL — FENOFIBRATE 54 MG TABLET: 54 | 90 days supply | Qty: 90 | Fill #3

## 2016-05-28 MED FILL — METFORMIN HCL ER 500 MG TAB: 500 | 45 days supply | Qty: 180 | Fill #2

## 2016-10-03 MED FILL — LEVOTHYROXINE 125 MCG TAB: 125 | 90 days supply | Qty: 90 | Fill #0

## 2016-10-03 MED FILL — METFORMIN HCL ER 500 MG TAB: 500 | 90 days supply | Qty: 180 | Fill #0

## 2016-11-27 MED FILL — FENOFIBRATE 54 MG TABLET: 54 | 90 days supply | Qty: 180 | Fill #0

## 2016-11-27 MED FILL — LEVOTHYROXINE 125 MCG TAB: 125 | 90 days supply | Qty: 90 | Fill #1

## 2016-11-27 MED FILL — METFORMIN HCL ER 500 MG TAB: 500 | 90 days supply | Qty: 180 | Fill #1

## 2017-02-01 MED FILL — LEVOTHYROXINE 125 MCG TABLE: 125 | 30 days supply | Qty: 30 | Fill #0

## 2017-03-25 MED FILL — AMOXICILLIN 500 MG CAPSULE: 500 | 7 days supply | Qty: 21 | Fill #0

## 2017-04-11 MED FILL — LEVOTHYROXINE 125 MCG TABLE: 125 | 30 days supply | Qty: 30 | Fill #1

## 2017-04-11 MED FILL — METFORMIN HCL ER 500 MG TAB: 500 | 30 days supply | Qty: 60 | Fill #0

## 2017-05-03 MED FILL — FENOFIBRATE 54 MG TABLET: 54 | 30 days supply | Qty: 60 | Fill #0

## 2017-05-23 MED FILL — LEVOTHYROXINE 125 MCG TABLE: 125 | 30 days supply | Qty: 30 | Fill #2

## 2017-05-23 MED FILL — METFORMIN HCL ER 500 MG TAB: 500 | 30 days supply | Qty: 60 | Fill #1

## 2017-05-23 MED FILL — FENOFIBRATE 54 MG TABLET: 54 | 30 days supply | Qty: 60 | Fill #1

## 2017-09-12 MED FILL — METFORMIN HCL ER 500 MG TAB: 500 | 30 days supply | Qty: 60 | Fill #2

## 2017-09-12 MED FILL — FENOFIBRATE 54 MG TABLET: 54 | 30 days supply | Qty: 60 | Fill #2

## 2017-09-12 MED FILL — LEVOTHYROXINE 137 MCG TABLE: 137 | 30 days supply | Qty: 30 | Fill #0

## 2017-10-17 MED FILL — LEVOTHYROXINE 137 MCG TABLE: 137 | 30 days supply | Qty: 30 | Fill #1

## 2017-12-02 MED FILL — FENOFIBRATE 54 MG TABLET: 54 | 30 days supply | Qty: 60 | Fill #3

## 2017-12-25 MED FILL — LEVOTHYROXINE 137 MCG TABLE: 137 | 30 days supply | Qty: 30 | Fill #2

## 2018-03-27 MED FILL — METFORMIN HCL ER 500 MG TAB: 500 | 30 days supply | Qty: 60 | Fill #3

## 2018-03-27 MED FILL — FENOFIBRATE 54 MG TABLET: 54 | 90 days supply | Qty: 180 | Fill #0

## 2018-03-27 MED FILL — LEVOTHYROXINE 137 MCG TABLE: 137 | 30 days supply | Qty: 30 | Fill #3

## 2018-05-14 MED FILL — LEVOTHYROXINE 137 MCG TABLE: 137 | 30 days supply | Qty: 30 | Fill #4

## 2018-05-15 MED FILL — METFORMIN HCL ER 500 MG TAB: 500 | 90 days supply | Qty: 180 | Fill #0

## 2018-06-24 MED FILL — LEVOTHYROXINE 137 MCG TABLE: 137 | 30 days supply | Qty: 30 | Fill #5

## 2018-08-01 MED FILL — FENOFIBRATE 54 MG TABLET: 54 | 90 days supply | Qty: 180 | Fill #1

## 2018-08-01 MED FILL — LEVOTHYROXINE 137 MCG TABLE: 137 | 90 days supply | Qty: 90 | Fill #0

## 2018-09-10 MED FILL — FLUCONAZOLE 150 MG TABS: 150 | 3 days supply | Qty: 3 | Fill #0

## 2018-11-04 MED FILL — LEVOTHYROXINE 137 MCG TABLE: 137 | 90 days supply | Qty: 90 | Fill #0

## 2018-11-04 MED FILL — FENOFIBRATE 54 MG TABLET: 54 | 90 days supply | Qty: 180 | Fill #2

## 2019-02-06 MED FILL — FENOFIBRATE 54 MG TABLET: 54 | 90 days supply | Qty: 180 | Fill #3

## 2019-02-06 MED FILL — metFORMIN HCL ER 500 MG TB2: 500 | 30 days supply | Qty: 60 | Fill #1

## 2019-02-06 MED FILL — LEVOTHYROXINE 137 MCG TABLE: 137 | 90 days supply | Qty: 90 | Fill #1

## 2019-04-24 MED FILL — metFORMIN HCL ER 500 MG TB2: 500 | 90 days supply | Qty: 180 | Fill #0

## 2019-05-01 MED FILL — LEVOTHYROXINE 137 MCG TABLE: 137 | 90 days supply | Qty: 90 | Fill #2

## 2019-07-27 MED FILL — LEVOTHYROXINE 137 MCG TABLE: 137 | 90 days supply | Qty: 90 | Fill #3

## 2019-07-27 MED FILL — metFORMIN HCL ER 500 MG TB2: 500 | 30 days supply | Qty: 60 | Fill #1

## 2019-09-09 MED FILL — FLUCONAZOLE 150 MG TABS: 150 | 3 days supply | Qty: 3 | Fill #0

## 2019-09-10 MED FILL — LEVOTHYROXINE 137 MCG TABLE: 137 | 90 days supply | Qty: 90 | Fill #3

## 2019-09-10 MED FILL — METFORMIN HCL ER 500 MG TB2: 500 | 90 days supply | Qty: 180 | Fill #1

## 2019-09-11 MED FILL — FENOFIBRATE 54 MG TABLET: 54 | 90 days supply | Qty: 180 | Fill #0

## 2020-01-08 MED FILL — FENOFIBRATE 54 MG TABLET: 54 | 90 days supply | Qty: 180 | Fill #1

## 2020-01-08 MED FILL — metFORMIN HCL ER 500 MG TB2: 500 | 90 days supply | Qty: 180 | Fill #2

## 2020-01-11 MED FILL — LEVOTHYROXINE 137 MCG TABLE: 137 | 90 days supply | Qty: 90 | Fill #0

## 2020-08-09 MED FILL — LEVOTHYROXINE 137 MCG TABLE: 137 | 90 days supply | Qty: 90 | Fill #2

## 2020-08-09 MED FILL — FENOFIBRATE 54 MG TABLET: 54 | 90 days supply | Qty: 180 | Fill #3

## 2020-08-09 MED FILL — metFORMIN HCL ER 500 MG TB2: 500 | 90 days supply | Qty: 180 | Fill #1

## 2020-08-16 MED FILL — AMOXICILLIN 500 MG CAPSULE: 500 | 7 days supply | Qty: 28 | Fill #0

## 2020-08-16 MED FILL — IBUPROFEN 800 MG TAB: 800 | 7 days supply | Qty: 20 | Fill #0

## 2020-08-16 MED FILL — ACETAMINOPHEN/COD #3 TABLET: 300-30 | 3 days supply | Qty: 12 | Fill #0

## 2020-09-14 ENCOUNTER — Other Ambulatory Visit (HOSPITAL_COMMUNITY): Payer: Self-pay | Admitting: Endocrinology

## 2020-09-14 MED FILL — ACCU-CHEK GUIDE W/DEVICE KI: W/DEVICE | 30 days supply | Qty: 1 | Fill #0

## 2020-09-14 MED FILL — ACCU-CHEK SOFTCLIX LANCETS: 34 days supply | Qty: 100 | Fill #0

## 2020-09-14 MED FILL — LOSARTAN POTASSIUM 25 MG TA: 25 | 30 days supply | Qty: 30 | Fill #0

## 2020-09-14 MED FILL — SYNJARDY XR 10-1000 MG TB24: 10-1000 | 30 days supply | Qty: 30 | Fill #0

## 2020-09-14 MED FILL — ACCU-CHEK GUIDE TEST STRIP: 34 days supply | Qty: 50 | Fill #0

## 2020-10-07 MED FILL — SYNJARDY XR 10-1000 MG TB24: 10-1000 | 30 days supply | Qty: 30 | Fill #1

## 2020-10-13 MED FILL — ACCU-CHEK SOFTCLIX LANCETS: 34 days supply | Qty: 100 | Fill #1

## 2020-10-13 MED FILL — ACCU-CHEK GUIDE TEST STRIP: 34 days supply | Qty: 50 | Fill #1

## 2020-10-13 MED FILL — LOSARTAN POTASSIUM 25 MG TA: 25 | 30 days supply | Qty: 30 | Fill #1

## 2020-11-15 ENCOUNTER — Other Ambulatory Visit (HOSPITAL_COMMUNITY): Payer: Self-pay | Admitting: Endocrinology

## 2020-11-15 MED FILL — SYNJARDY XR 10-1000 MG TB24: 10-1000 | 30 days supply | Qty: 30 | Fill #2

## 2020-11-15 MED FILL — LEVOTHYROXINE 137 MCG TABLE: 137 | 90 days supply | Qty: 90 | Fill #3

## 2020-11-15 MED FILL — FENOFIBRATE 54 MG TABLET: 54 | 90 days supply | Qty: 180 | Fill #0

## 2020-11-15 MED FILL — LOSARTAN POTASSIUM 25 MG TA: 25 | 30 days supply | Qty: 30 | Fill #2

## 2020-11-17 MED FILL — ACCU-CHEK SOFTCLIX LANCETS: 34 days supply | Qty: 100 | Fill #1

## 2020-11-29 ENCOUNTER — Other Ambulatory Visit (HOSPITAL_COMMUNITY): Payer: Self-pay | Admitting: Endocrinology

## 2020-11-29 MED FILL — SYNJARDY 5-500 MG TABLET: 5-500 | 30 days supply | Qty: 30 | Fill #0

## 2021-01-11 ENCOUNTER — Other Ambulatory Visit (HOSPITAL_COMMUNITY): Payer: Self-pay | Admitting: Endocrinology

## 2021-01-11 MED FILL — SYNJARDY 5-500 MG TABLET: 5-500 | 30 days supply | Qty: 30 | Fill #1

## 2021-01-11 MED FILL — AMOX-CLAV 500-125 MG TABLET: 500-125 | 7 days supply | Qty: 14 | Fill #0

## 2021-01-11 MED FILL — LOSARTAN POTASSIUM 25 MG TA: 25 | 30 days supply | Qty: 30 | Fill #3

## 2021-02-24 ENCOUNTER — Other Ambulatory Visit (HOSPITAL_COMMUNITY): Payer: Self-pay | Admitting: Endocrinology

## 2021-02-24 MED FILL — SYNJARDY 5-500 MG TABLET: 5-500 | 30 days supply | Qty: 30 | Fill #2

## 2021-02-24 MED FILL — ACCU-CHEK GUIDE TEST STRIP: 34 days supply | Qty: 50 | Fill #2

## 2021-02-24 MED FILL — ACCU-CHEK SOFTCLIX LANCETS: 34 days supply | Qty: 100 | Fill #2

## 2021-02-24 MED FILL — LOSARTAN POTASSIUM 25 MG TA: 25 | 30 days supply | Qty: 30 | Fill #4

## 2021-02-24 MED FILL — FENOFIBRATE 54 MG TABLET: 54 | 90 days supply | Qty: 180 | Fill #1

## 2021-02-25 MED FILL — LEVOTHYROXINE SODIUM 137 MC: 137 | 90 days supply | Qty: 90 | Fill #0

## 2021-03-04 ENCOUNTER — Other Ambulatory Visit (HOSPITAL_COMMUNITY): Payer: Self-pay

## 2021-03-04 MED FILL — Fenofibrate Tab 54 MG: ORAL | 90 days supply | Qty: 180 | Fill #0 | Status: AC

## 2021-03-04 MED FILL — Glucose Blood Test Strip: 34 days supply | Qty: 100 | Fill #0 | Status: AC

## 2021-03-04 MED FILL — Levothyroxine Sodium Tab 137 MCG: ORAL | 90 days supply | Qty: 90 | Fill #0 | Status: AC

## 2021-03-04 MED FILL — Empagliflozin-Metformin HCl Tab 5-500 MG: ORAL | 30 days supply | Qty: 30 | Fill #0 | Status: AC

## 2021-03-04 MED FILL — Lancets: 34 days supply | Qty: 100 | Fill #0 | Status: AC

## 2021-03-04 MED FILL — Losartan Potassium Tab 25 MG: ORAL | 30 days supply | Qty: 30 | Fill #0 | Status: AC

## 2021-03-05 ENCOUNTER — Other Ambulatory Visit (HOSPITAL_COMMUNITY): Payer: Self-pay

## 2021-03-06 ENCOUNTER — Other Ambulatory Visit (HOSPITAL_COMMUNITY): Payer: Self-pay

## 2021-04-17 ENCOUNTER — Other Ambulatory Visit (HOSPITAL_COMMUNITY): Payer: Self-pay

## 2021-04-17 MED FILL — Levothyroxine Sodium Tab 137 MCG: ORAL | 90 days supply | Qty: 90 | Fill #1 | Status: CN

## 2021-04-17 MED FILL — Empagliflozin-Metformin HCl Tab 5-500 MG: ORAL | 30 days supply | Qty: 30 | Fill #1 | Status: AC

## 2021-04-17 MED FILL — Fenofibrate Tab 54 MG: ORAL | 90 days supply | Qty: 180 | Fill #1 | Status: CN

## 2021-04-17 MED FILL — Losartan Potassium Tab 25 MG: ORAL | 30 days supply | Qty: 30 | Fill #1 | Status: CN

## 2021-04-25 ENCOUNTER — Other Ambulatory Visit (HOSPITAL_COMMUNITY): Payer: Self-pay

## 2021-05-06 ENCOUNTER — Other Ambulatory Visit (HOSPITAL_COMMUNITY): Payer: Self-pay

## 2021-05-06 MED FILL — Losartan Potassium Tab 25 MG: ORAL | 30 days supply | Qty: 30 | Fill #1 | Status: AC

## 2021-05-08 ENCOUNTER — Other Ambulatory Visit (HOSPITAL_COMMUNITY): Payer: Self-pay

## 2021-05-08 MED FILL — Levothyroxine Sodium Tab 137 MCG: ORAL | 90 days supply | Qty: 90 | Fill #1 | Status: CN

## 2021-05-08 MED FILL — Empagliflozin-Metformin HCl Tab 5-500 MG: ORAL | 30 days supply | Qty: 30 | Fill #2 | Status: CN

## 2021-05-22 ENCOUNTER — Other Ambulatory Visit (HOSPITAL_COMMUNITY): Payer: Self-pay

## 2021-05-22 MED FILL — Fenofibrate Tab 54 MG: ORAL | 90 days supply | Qty: 180 | Fill #1 | Status: AC

## 2021-05-22 MED FILL — Empagliflozin-Metformin HCl Tab 5-500 MG: ORAL | 30 days supply | Qty: 30 | Fill #2 | Status: AC

## 2021-05-22 MED FILL — Levothyroxine Sodium Tab 137 MCG: ORAL | 90 days supply | Qty: 90 | Fill #1 | Status: AC

## 2021-05-22 MED FILL — Empagliflozin-Metformin HCl Tab 5-500 MG: ORAL | 30 days supply | Qty: 30 | Fill #2 | Status: CN

## 2021-05-23 ENCOUNTER — Other Ambulatory Visit (HOSPITAL_COMMUNITY): Payer: Self-pay

## 2021-05-25 ENCOUNTER — Emergency Department (HOSPITAL_BASED_OUTPATIENT_CLINIC_OR_DEPARTMENT_OTHER)
Admission: EM | Admit: 2021-05-25 | Discharge: 2021-05-25 | Discharge status: Against Medical Advice | Payer: Medicaid Other | Attending: Emergency Medicine | Admitting: Emergency Medicine

## 2021-05-25 ENCOUNTER — Encounter (HOSPITAL_BASED_OUTPATIENT_CLINIC_OR_DEPARTMENT_OTHER): Payer: Self-pay | Admitting: Emergency Medicine

## 2021-05-25 ENCOUNTER — Other Ambulatory Visit: Payer: Self-pay

## 2021-05-25 DIAGNOSIS — Z794 Long term (current) use of insulin: Secondary | ICD-10-CM | POA: Diagnosis not present

## 2021-05-25 DIAGNOSIS — D509 Iron deficiency anemia, unspecified: Secondary | ICD-10-CM | POA: Diagnosis not present

## 2021-05-25 DIAGNOSIS — U071 COVID-19: Secondary | ICD-10-CM

## 2021-05-25 DIAGNOSIS — Z79899 Other long term (current) drug therapy: Secondary | ICD-10-CM | POA: Insufficient documentation

## 2021-05-25 DIAGNOSIS — D649 Anemia, unspecified: Secondary | ICD-10-CM | POA: Diagnosis present

## 2021-05-25 DIAGNOSIS — E119 Type 2 diabetes mellitus without complications: Secondary | ICD-10-CM | POA: Diagnosis not present

## 2021-05-25 DIAGNOSIS — Z7984 Long term (current) use of oral hypoglycemic drugs: Secondary | ICD-10-CM | POA: Insufficient documentation

## 2021-05-25 DIAGNOSIS — R799 Abnormal finding of blood chemistry, unspecified: Secondary | ICD-10-CM | POA: Diagnosis present

## 2021-05-25 DIAGNOSIS — E039 Hypothyroidism, unspecified: Secondary | ICD-10-CM | POA: Insufficient documentation

## 2021-05-25 LAB — CBC WITH DIFFERENTIAL/PLATELET
Abs Immature Granulocytes: 0.02 10*3/uL (ref 0.00–0.07)
Basophils Absolute: 0.1 10*3/uL (ref 0.0–0.1)
Basophils Relative: 1 %
Eosinophils Absolute: 0.1 10*3/uL (ref 0.0–0.5)
Eosinophils Relative: 2 %
HCT: 23.7 % — ABNORMAL LOW (ref 36.0–46.0)
Hemoglobin: 5.7 g/dL — CL (ref 12.0–15.0)
Immature Granulocytes: 0 %
Lymphocytes Relative: 44 %
Lymphs Abs: 2.8 10*3/uL (ref 0.7–4.0)
MCH: 13.6 pg — ABNORMAL LOW (ref 26.0–34.0)
MCHC: 24.1 g/dL — ABNORMAL LOW (ref 30.0–36.0)
MCV: 56.6 fL — ABNORMAL LOW (ref 80.0–100.0)
Monocytes Absolute: 0.5 10*3/uL (ref 0.1–1.0)
Monocytes Relative: 8 %
Neutro Abs: 2.8 10*3/uL (ref 1.7–7.7)
Neutrophils Relative %: 45 %
Platelets: 494 10*3/uL — ABNORMAL HIGH (ref 150–400)
RBC: 4.19 MIL/uL (ref 3.87–5.11)
RDW: 23.6 % — ABNORMAL HIGH (ref 11.5–15.5)
WBC: 6.2 10*3/uL (ref 4.0–10.5)
nRBC: 0 % (ref 0.0–0.2)

## 2021-05-25 LAB — RETICULOCYTES
Immature Retic Fract: 16.9 % — ABNORMAL HIGH (ref 2.3–15.9)
RBC.: 4.08 MIL/uL (ref 3.87–5.11)
Retic Count, Absolute: 69.8 10*3/uL (ref 19.0–186.0)
Retic Ct Pct: 1.7 % (ref 0.4–3.1)

## 2021-05-25 LAB — RESP PANEL BY RT-PCR (FLU A&B, COVID) ARPGX2
Influenza A by PCR: NEGATIVE
Influenza B by PCR: NEGATIVE
SARS Coronavirus 2 by RT PCR: POSITIVE — AB

## 2021-05-25 LAB — BASIC METABOLIC PANEL
Anion gap: 10 (ref 5–15)
BUN: 14 mg/dL (ref 6–20)
CO2: 25 mmol/L (ref 22–32)
Calcium: 10 mg/dL (ref 8.9–10.3)
Chloride: 102 mmol/L (ref 98–111)
Creatinine, Ser: 0.65 mg/dL (ref 0.44–1.00)
GFR, Estimated: >60 mL/min (ref >60)
Glucose, Bld: 124 mg/dL — ABNORMAL HIGH (ref 70–99)
Potassium: 3.7 mmol/L (ref 3.5–5.1)
Sodium: 137 mmol/L (ref 135–145)

## 2021-05-25 LAB — IRON AND TIBC
Iron: 14 ug/dL — ABNORMAL LOW (ref 28–170)
Saturation Ratios: 2 % — ABNORMAL LOW (ref 10.4–31.8)
TIBC: 743 ug/dL — ABNORMAL HIGH (ref 250–450)
UIBC: 729 ug/dL

## 2021-05-25 LAB — FERRITIN: Ferritin: 1 ng/mL — ABNORMAL LOW (ref 11–307)

## 2021-05-25 LAB — FOLATE: Folate: 14.1 ng/mL (ref >5.9)

## 2021-05-25 LAB — VITAMIN B12: Vitamin B-12: 142 pg/mL — ABNORMAL LOW (ref 180–914)

## 2021-05-25 MED ORDER — FERROUS SULFATE 325 (65 FE) MG PO TABS: 325.0000 mg | ORAL_TABLET | Freq: Every day | ORAL | 0 refills | Status: DC

## 2021-05-25 NOTE — Discharge Instructions (Addendum)
You have severe anemia, that likely causes iron deficiency.  As discussed, we would have like you to be admitted to the hospital and get blood transfusion followed by iron infusion or oral iron supplement. You have requested that you be discharged to the hospital understanding full well the risk that are possible of persistent anemia.  Return to the ER if you start having worsening dizziness, near fainting spell, severe chest pain, shortness of breath.  We discussed the case with our hematologist.  They recommend that you call your primary care doctor and they can schedule you for iron infusion.  If they are unable to get you iron infusion then the PCP needs to give you recommendation or referral to the appropriate site.

## 2021-05-25 NOTE — ED Notes (Signed)
Patient family made aware of visitation policy regarding patients who have tested positive for COVID-19. Visitor is agreeable and this RN advised that it would be permissible to return to collect patient items once patient is ready for transfer.

## 2021-05-25 NOTE — ED Provider Notes (Addendum)
[facility] EMERGENC[facility] DEPT Provider Note   CSN: [id number] Arrival date & time: 05/25/21  1652     History Chief Complaint  Patient presents with   Abnormal Lab    Mary Logan is a 50 y.o. female.  HPI     50 year old female comes in with chief complaint of abnormal lab.  She has history of PCOS, diabetes, hypothyroidism.  Patient reports that she had not seen her PCP in a while.  She went in for routine work-up after having symptoms of weakness, fatigue, exertional shortness of breath and they noted that her hemoglobin was low.  She was advised to come to the ER.  Patient denied any heavy bleeding, bloody stools to me.  No history of blood transfusion.  She is not on any blood thinners.  Later on inpatient stay, her COVID-19 is positive.   Past Medical History:  Diagnosis Date   Diabetes mellitus    GDM - insulin   History of PCOS    tx with metformin   Hypertriglyceridemia    diet controlled   Hypothyroidism    Maternal anemia complicating pregnancy, childbirth, or the puerperium 06/24/2012   Postpartum care following cesarean delivery (7/22) 06/24/2012   Pregnancy And Insulin-Dependent Diabetes Mellitus (delivered) 06/24/2012   Thyroid disease     Patient Active Problem List   Diagnosis Date Noted   Symptomatic anemia 05/25/2021   Postpartum care following cesarean delivery (7/22) 06/24/2012   Maternal anemia complicating pregnancy, childbirth, or the puerperium 06/24/2012   Pregnancy And Insulin-Dependent Diabetes Mellitus (delivered) 06/24/2012    Past Surgical History:  Procedure Laterality Date   CESAREAN SECTION     x 2   CESAREAN SECTION  06/23/2012   Procedure: CESAREAN SECTION;  Surgeon: Princess Bruins, MD;  Location: [facility];  Service: Gynecology;  Laterality: N/A;  EDD: 06/29/12     OB History     Gravida  5   Para  3   Term  2   Preterm  1   AB  2   Living  1      SAB  2   IAB      Ectopic      Multiple       Live Births  2           Family History  Problem Relation Age of Onset   Diabetes Mother    Diabetes Brother     Social History   Tobacco Use   Smoking status: Never   Smokeless tobacco: Never  Substance Use Topics   Alcohol use: No   Drug use: No    Home Medications Prior to Admission medications   Medication Sig Start Date End Date Taking? Authorizing Provider  ferrous sulfate 325 (65 FE) MG tablet Take 1 tablet (325 mg total) by mouth daily with breakfast. 05/25/21  Yes Nhu Glasby, MD  Accu-Chek Softclix Lancets lancets USE AS DIRECTED ONCE DAILY 09/14/20 09/14/21  Jacelyn Pi, MD  amoxicillin-clavulanate (AUGMENTIN) 500-125 MG tablet TAKE 1 TABLET BY MOUTH EVERY 12 HOURS FOR 7 DAYS 01/11/21 01/11/22  Jacelyn Pi, MD  Blood Glucose Monitoring Suppl (ACCU-CHEK GUIDE) w/Device KIT USE AS DIRECTED 09/14/20 09/14/21  Jacelyn Pi, MD  Empagliflozin-metFORMIN HCl 5-500 MG TABS TAKE 1 TABLET BY MOUTH ONCE A DAY WITH A MEAL. 11/29/20 11/29/21  Jacelyn Pi, MD  fenofibrate 54 MG tablet TAKE 2 TABLETS BY MOUTH ONCE A DAY WITH FOOD 11/15/20 11/15/21  Jacelyn Pi, MD  glucose blood test strip USE  AS DIRECTED TO TEST ONCE A DAILY 09/14/20 09/14/21  Jacelyn Pi, MD  insulin lispro (HUMALOG) 100 UNIT/ML injection Inject 10 Units into the skin daily before breakfast. 06/26/12   Juliene Pina, CNM  insulin lispro (HUMALOG) 100 UNIT/ML injection Inject 10 Units into the skin daily before lunch. 06/26/12   Juliene Pina, CNM  insulin lispro (HUMALOG) 100 UNIT/ML injection Inject 11 Units into the skin daily before supper. 06/26/12   Juliene Pina, CNM  iron polysaccharides (NU-IRON) 150 MG capsule Take 1 capsule (150 mg total) by mouth daily. 06/26/12 06/26/13  Juliene Pina, CNM  levothyroxine (SYNTHROID) 137 MCG tablet TAKE 1 TABLET BY MOUTH ONCE DAILY 02/24/21 02/24/22  Jacelyn Pi, MD  levothyroxine (SYNTHROID, LEVOTHROID) 112 MCG tablet Take 112 mcg by mouth  daily.    [provider]  losartan (COZAAR) 25 MG tablet TAKE 1 TABLET BY MOUTH ONCE A DAY 09/14/20 09/14/21  Jacelyn Pi, MD  Misc. Devices (BREAST PUMP) MISC 1 Units by Does not apply route as needed. 06/26/12   Juliene Pina, CNM  omega-3 acid ethyl esters (LOVAZA) 1 G capsule Take 3 g by mouth 2 (two) times daily.    [provider]  Prenatal Vit-Fe Fumarate-FA (PRENATAL VITAMINS PLUS PO) Take 1 tablet by mouth daily.    [provider]    Allergies    Patient has no known allergies.  Review of Systems   Review of Systems  Constitutional:  Positive for activity change.  Respiratory:  Positive for shortness of breath.   Cardiovascular:  Negative for chest pain.  Gastrointestinal:  Negative for blood in stool.  Genitourinary:  Negative for vaginal bleeding.  Neurological:  Negative for syncope.  All other systems reviewed and are negative.  Physical Exam Updated Vital Signs BP 112/68   Pulse 82   Temp 98.5 F (36.9 C)   Resp 17   Ht 4' 11"  (1.499 m)   Wt 50.8 kg   SpO2 100%   BMI 22.62 kg/m   Physical Exam Vitals and nursing note reviewed.  Constitutional:      Appearance: She is well-developed.  HENT:     Head: Atraumatic.  Cardiovascular:     Rate and Rhythm: Normal rate.  Pulmonary:     Effort: Pulmonary effort is normal.  Musculoskeletal:     Cervical back: Normal range of motion and neck supple.  Skin:    General: Skin is warm and dry.  Neurological:     Mental Status: She is alert and oriented to person, place, and time.    ED Results / Procedures / Treatments   Labs (all labs ordered are listed, but only abnormal results are displayed) Labs Reviewed  RESP PANEL BY RT-PCR (FLU A&B, COVID) ARPGX2 - Abnormal; Notable for the following components:      Result Value   SARS Coronavirus 2 by RT PCR POSITIVE (*)    All other components within normal limits  BASIC METABOLIC PANEL - Abnormal; Notable for the following  components:   Glucose, Bld 124 (*)    All other components within normal limits  CBC WITH DIFFERENTIAL/PLATELET - Abnormal; Notable for the following components:   Hemoglobin 5.7 (*)    HCT 23.7 (*)    MCV 56.6 (*)    MCH 13.6 (*)    MCHC 24.1 (*)    RDW 23.6 (*)    Platelets 494 (*)    All other components within normal limits  VITAMIN B12 -  Abnormal; Notable for the following components:   Vitamin B-12 142 (*)    All other components within normal limits  IRON AND TIBC - Abnormal; Notable for the following components:   Iron 14 (*)    TIBC 743 (*)    Saturation Ratios 2 (*)    All other components within normal limits  FERRITIN - Abnormal; Notable for the following components:   Ferritin 1 (*)    All other components within normal limits  RETICULOCYTES - Abnormal; Notable for the following components:   Immature Retic Fract 16.9 (*)    All other components within normal limits  FOLATE    EKG None  Radiology No results found.  Procedures .Critical Care  Date/Time: 05/25/2021 8:04 PM Performed by: Varney Biles, MD Authorized by: Varney Biles, MD   Critical care provider statement:    Critical care time (minutes):  74   Critical care was necessary to treat or prevent imminent or life-threatening deterioration of the following conditions:  Circulatory failure   Critical care was time spent personally by me on the following activities:  Discussions with consultants, evaluation of patient's response to treatment, examination of patient, ordering and performing treatments and interventions, ordering and review of laboratory studies, ordering and review of radiographic studies, pulse oximetry, re-evaluation of patient's condition, obtaining history from patient or surrogate and review of old charts   Medications Ordered in ED Medications - No data to display  ED Course  I have reviewed the triage vital signs and the nursing notes.  Pertinent labs & imaging results  that were available during my care of the patient were reviewed by me and considered in my medical decision making (see chart for details).  Clinical Course as of 05/25/21 2318  Thu May 25, 2021  2006 SARS Coronavirus 2 by RT PCR(!): POSITIVE Incidental finding of positive COVID-19.  She did not have any symptoms of COVID-19 during our initial assessment. [AN]  2316 Patient wants to leave against medical advice. Patient understands that her actions will lead to inadequate medical workup, and that she is at risk of complications of missed diagnosis, which includes morbidity and mortality. Pt has been in touch with the family doctor, they have agreed that she is having chronic anemia and does not need emergency transfusion.  Patient has fatigue, but continues to be quite active.  They feel like IV iron infusion and conservative approach would be better than transfusion of blood immediately.  I discussed with the family that although that is an acceptable approach, likely not ideal and suboptimal as it can lead to complications like stroke, fainting, heart attack. Opportunity to change mind given.  Patient is demonstrating good capacity to make decision. Patient understands that she needs to return to the ER immediately if her symptoms get worse.   I did discuss the case with Dr. Benay Spice.  He indicates that it would be possible for them to get the patient in immediately.  He recommends the patient call the PCP who can either give the patient IV infusion of iron or set up an outpatient follow-up. [AN]    Clinical Course User Index [AN] Varney Biles, MD   MDM Rules/Calculators/A&P                          50 year old female comes in a chief complaint of abnormal lab.  She is noted to be anemic.  Patient is symptomatic with it, reporting dizziness, shortness of breath,  fatigue.  She appears pale.  She denies any active blood loss.  No history of cancer.  No history of transfusion.  Clinically,  it does appear to be a chronic issue given that patient reports being symptomatic for at least the last several weeks.   She will be admitted to the hospital for anemia work-up and transfusion.   Final Clinical Impression(s) / ED Diagnoses Final diagnoses:  COVID-19  Symptomatic anemia  Iron deficiency anemia, unspecified iron deficiency anemia type    Rx / DC Orders ED Discharge Orders          Ordered    ferrous sulfate 325 (65 FE) MG tablet  Daily with breakfast        05/25/21 2314             Varney Biles, MD 05/25/21 2006    Varney Biles, MD 05/25/21 2318

## 2021-05-25 NOTE — ED Notes (Signed)
Paged Dr Truett Perna to Dr Rhunette Croft

## 2021-05-25 NOTE — ED Notes (Signed)
Called patient placement per Dr Rhunette Croft and advised patient leaving AMA does not need a bed

## 2021-05-25 NOTE — ED Notes (Signed)
Called Dr Kalman Drape office--Mary Logan

## 2021-05-25 NOTE — ED Notes (Signed)
Pt. Husband keeps stepping into hall, he has been informed that she is covid positive and cannot be in hall. We have informed not to do this several times.

## 2021-05-25 NOTE — ED Notes (Signed)
Pt stated that he IV hurts. IV was flushed with N/S and flushes well. IV also draws blood with no problems. Pt stated that the IV did not hurt when NS was infusing from the flush but hurt once I stooped. Same was pased on to report to recheck IV

## 2021-05-25 NOTE — ED Notes (Signed)
Pt. Did not want to wait for a room. Pt.s husband stated we were doing too much, wanted to get home to kids.

## 2021-05-25 NOTE — ED Triage Notes (Signed)
Pt arrives to ED with c/o of low hemoglobin of 5.8. Pt had her lab work drawn at her PCP office yesterday. Pt reports fatigue and lethergy over the past few weeks. Pt denies any signs of abnormal bleeding.

## 2021-05-25 NOTE — ED Notes (Signed)
Allena Katz, MD made aware of patients COVID-19 status; review further orders regarding new results from MD.

## 2021-05-26 ENCOUNTER — Encounter (HOSPITAL_COMMUNITY): Payer: Self-pay

## 2021-05-26 ENCOUNTER — Other Ambulatory Visit (HOSPITAL_COMMUNITY): Payer: Self-pay

## 2021-05-26 ENCOUNTER — Other Ambulatory Visit: Payer: Self-pay

## 2021-05-26 ENCOUNTER — Emergency Department (HOSPITAL_COMMUNITY)
Admission: EM | Admit: 2021-05-26 | Discharge: 2021-05-26 | Disposition: A | Discharge status: Home / Self Care | Payer: Medicaid Other | Attending: Emergency Medicine | Admitting: Emergency Medicine

## 2021-05-26 DIAGNOSIS — D649 Anemia, unspecified: Secondary | ICD-10-CM | POA: Diagnosis not present

## 2021-05-26 DIAGNOSIS — E119 Type 2 diabetes mellitus without complications: Secondary | ICD-10-CM | POA: Insufficient documentation

## 2021-05-26 DIAGNOSIS — Z79899 Other long term (current) drug therapy: Secondary | ICD-10-CM | POA: Diagnosis not present

## 2021-05-26 DIAGNOSIS — E039 Hypothyroidism, unspecified: Secondary | ICD-10-CM | POA: Insufficient documentation

## 2021-05-26 DIAGNOSIS — R5383 Other fatigue: Secondary | ICD-10-CM | POA: Diagnosis present

## 2021-05-26 DIAGNOSIS — Z7984 Long term (current) use of oral hypoglycemic drugs: Secondary | ICD-10-CM | POA: Insufficient documentation

## 2021-05-26 DIAGNOSIS — Z794 Long term (current) use of insulin: Secondary | ICD-10-CM | POA: Diagnosis not present

## 2021-05-26 LAB — COMPREHENSIVE METABOLIC PANEL
ALT: 16 U/L (ref 0–44)
AST: 25 U/L (ref 15–41)
Albumin: 4.8 g/dL (ref 3.5–5.0)
Alkaline Phosphatase: 41 U/L (ref 38–126)
Anion gap: 10 (ref 5–15)
BUN: 14 mg/dL (ref 6–20)
CO2: 24 mmol/L (ref 22–32)
Calcium: 9.8 mg/dL (ref 8.9–10.3)
Chloride: 106 mmol/L (ref 98–111)
Creatinine, Ser: 0.66 mg/dL (ref 0.44–1.00)
GFR, Estimated: >60 mL/min (ref >60)
Glucose, Bld: 121 mg/dL — ABNORMAL HIGH (ref 70–99)
Potassium: 3.8 mmol/L (ref 3.5–5.1)
Sodium: 140 mmol/L (ref 135–145)
Total Bilirubin: 0.6 mg/dL (ref 0.3–1.2)
Total Protein: 8.9 g/dL — ABNORMAL HIGH (ref 6.5–8.1)

## 2021-05-26 LAB — VITAMIN B12: Vitamin B-12: 169 pg/mL — ABNORMAL LOW (ref 180–914)

## 2021-05-26 LAB — RETICULOCYTES
Immature Retic Fract: 26.7 % — ABNORMAL HIGH (ref 2.3–15.9)
RBC.: 4.63 MIL/uL (ref 3.87–5.11)
Retic Count, Absolute: 96.3 10*3/uL (ref 19.0–186.0)
Retic Ct Pct: 2.1 % (ref 0.4–3.1)

## 2021-05-26 LAB — CBC WITH DIFFERENTIAL/PLATELET
Abs Immature Granulocytes: 0.02 10*3/uL (ref 0.00–0.07)
Basophils Absolute: 0 10*3/uL (ref 0.0–0.1)
Basophils Relative: 1 %
Eosinophils Absolute: 0.1 10*3/uL (ref 0.0–0.5)
Eosinophils Relative: 2 %
HCT: 26.8 % — ABNORMAL LOW (ref 36.0–46.0)
Hemoglobin: 6.3 g/dL — CL (ref 12.0–15.0)
Immature Granulocytes: 0 %
Lymphocytes Relative: 37 %
Lymphs Abs: 2 10*3/uL (ref 0.7–4.0)
MCH: 13.5 pg — ABNORMAL LOW (ref 26.0–34.0)
MCHC: 23.5 g/dL — ABNORMAL LOW (ref 30.0–36.0)
MCV: 57.5 fL — ABNORMAL LOW (ref 80.0–100.0)
Monocytes Absolute: 0.3 10*3/uL (ref 0.1–1.0)
Monocytes Relative: 6 %
Neutro Abs: 2.9 10*3/uL (ref 1.7–7.7)
Neutrophils Relative %: 54 %
Platelets: 523 10*3/uL — ABNORMAL HIGH (ref 150–400)
RBC: 4.66 MIL/uL (ref 3.87–5.11)
RDW: 23.7 % — ABNORMAL HIGH (ref 11.5–15.5)
WBC: 5.4 10*3/uL (ref 4.0–10.5)
nRBC: 0.4 % — ABNORMAL HIGH (ref 0.0–0.2)

## 2021-05-26 LAB — PREPARE RBC (CROSSMATCH)

## 2021-05-26 LAB — POC OCCULT BLOOD, ED: Fecal Occult Bld: NEGATIVE

## 2021-05-26 LAB — IRON AND TIBC
Iron: 18 ug/dL — ABNORMAL LOW (ref 28–170)
Saturation Ratios: 2 % — ABNORMAL LOW (ref 10.4–31.8)
TIBC: 761 ug/dL — ABNORMAL HIGH (ref 250–450)
UIBC: 743 ug/dL

## 2021-05-26 LAB — FERRITIN: Ferritin: 1 ng/mL — ABNORMAL LOW (ref 11–307)

## 2021-05-26 LAB — FOLATE: Folate: 14.1 ng/mL (ref >5.9)

## 2021-05-26 MED ORDER — FERROUS SULFATE 325 (65 FE) MG PO TABS: 325.0000 mg | ORAL_TABLET | Freq: Every day | ORAL | 0 refills | Status: DC

## 2021-05-26 MED ORDER — FERROUS SULFATE 325 (65 FE) MG PO TABS
325.0000 mg | ORAL_TABLET | Freq: Every day | ORAL | 0 refills | Status: DC
  Filled 2021-05-26: qty 30, 30d supply, fill #0

## 2021-05-26 MED ORDER — SODIUM CHLORIDE 0.9 % IV SOLN: 10.0000 mL/h | Freq: Once | INTRAVENOUS | Status: DC

## 2021-05-26 NOTE — ED Provider Notes (Signed)
[facility] DEPT Provider Note   CSN: [id number] Arrival date & time: 05/26/21  0932     History Chief Complaint  Patient presents with   Abnormal Lab    Mary Logan is a 51 y.o. female.  HPI     50yo female with hx PCOS, hypothyroidism, hypertriglyceridemia, presents with concern for anemia.   2 years of fatigue No shortness of breath except with hiking, no chest pain Sometimes lightheaded No black or bloody stools, no heavy menses Yesterday last day of period not heavy 2-3 days No falls, trauma or other bleeding   No cough, no fever, no nausea/vomiting No gross hematuria  Past Medical History:  Diagnosis Date   Diabetes mellitus    GDM - insulin   History of PCOS    tx with metformin   Hypertriglyceridemia    diet controlled   Hypothyroidism    Maternal anemia complicating pregnancy, childbirth, or the puerperium 06/24/2012   Postpartum care following cesarean delivery (7/22) 06/24/2012   Pregnancy And Insulin-Dependent Diabetes Mellitus (delivered) 06/24/2012   Thyroid disease     Patient Active Problem List   Diagnosis Date Noted   Symptomatic anemia 05/25/2021   Postpartum care following cesarean delivery (7/22) 06/24/2012   Maternal anemia complicating pregnancy, childbirth, or the puerperium 06/24/2012   Pregnancy And Insulin-Dependent Diabetes Mellitus (delivered) 06/24/2012    Past Surgical History:  Procedure Laterality Date   CESAREAN SECTION     x 2   CESAREAN SECTION  06/23/2012   Procedure: CESAREAN SECTION;  Surgeon: Princess Bruins, MD;  Location: [facility];  Service: Gynecology;  Laterality: N/A;  EDD: 06/29/12     OB History     Gravida  5   Para  3   Term  2   Preterm  1   AB  2   Living  1      SAB  2   IAB      Ectopic      Multiple      Live Births  2           Family History  Problem Relation Age of Onset   Diabetes Mother    Diabetes Brother     Social History    Tobacco Use   Smoking status: Never   Smokeless tobacco: Never  Vaping Use   Vaping Use: Never used  Substance Use Topics   Alcohol use: No   Drug use: No    Home Medications Prior to Admission medications   Medication Sig Start Date End Date Taking? Authorizing Provider  Accu-Chek Softclix Lancets lancets USE AS DIRECTED ONCE DAILY 09/14/20 09/14/21  Jacelyn Pi, MD  amoxicillin-clavulanate (AUGMENTIN) 500-125 MG tablet TAKE 1 TABLET BY MOUTH EVERY 12 HOURS FOR 7 DAYS 01/11/21 01/11/22  Jacelyn Pi, MD  Blood Glucose Monitoring Suppl (ACCU-CHEK GUIDE) w/Device KIT USE AS DIRECTED 09/14/20 09/14/21  Jacelyn Pi, MD  Empagliflozin-metFORMIN HCl 5-500 MG TABS TAKE 1 TABLET BY MOUTH ONCE A DAY WITH A MEAL. 11/29/20 11/29/21  Jacelyn Pi, MD  fenofibrate 54 MG tablet TAKE 2 TABLETS BY MOUTH ONCE A DAY WITH FOOD 11/15/20 11/15/21  Jacelyn Pi, MD  ferrous sulfate 325 (65 FE) MG tablet Take 1 tablet (325 mg total) by mouth daily with breakfast. 05/26/21   Little, Wenda Overland, MD  glucose blood test strip USE AS DIRECTED TO TEST ONCE A DAILY 09/14/20 09/14/21  Jacelyn Pi, MD  insulin lispro (HUMALOG) 100 UNIT/ML injection Inject 10 Units into  the skin daily before breakfast. 06/26/12   Juliene Pina, CNM  insulin lispro (HUMALOG) 100 UNIT/ML injection Inject 10 Units into the skin daily before lunch. 06/26/12   Juliene Pina, CNM  insulin lispro (HUMALOG) 100 UNIT/ML injection Inject 11 Units into the skin daily before supper. 06/26/12   Juliene Pina, CNM  iron polysaccharides (NU-IRON) 150 MG capsule Take 1 capsule (150 mg total) by mouth daily. 06/26/12 06/26/13  Juliene Pina, CNM  levothyroxine (SYNTHROID) 137 MCG tablet TAKE 1 TABLET BY MOUTH ONCE DAILY 02/24/21 02/24/22  Jacelyn Pi, MD  levothyroxine (SYNTHROID, LEVOTHROID) 112 MCG tablet Take 112 mcg by mouth daily.    [provider]  losartan (COZAAR) 25 MG tablet TAKE 1 TABLET BY MOUTH ONCE A DAY  09/14/20 09/14/21  Jacelyn Pi, MD  Misc. Devices (BREAST PUMP) MISC 1 Units by Does not apply route as needed. 06/26/12   Juliene Pina, CNM  omega-3 acid ethyl esters (LOVAZA) 1 G capsule Take 3 g by mouth 2 (two) times daily.    [provider]  Prenatal Vit-Fe Fumarate-FA (PRENATAL VITAMINS PLUS PO) Take 1 tablet by mouth daily.    [provider]    Allergies    Patient has no known allergies.  Review of Systems   Review of Systems  Constitutional:  Positive for fatigue. Negative for fever.  Respiratory:  Negative for cough and shortness of breath.   Cardiovascular:  Negative for chest pain.  Gastrointestinal:  Negative for abdominal pain, anal bleeding, blood in stool, diarrhea, nausea and vomiting.  Genitourinary:  Negative for dysuria and hematuria.  Skin:  Negative for rash.  Neurological:  Negative for light-headedness (sometimes not at this time) and headaches.   Physical Exam Updated Vital Signs BP 110/74   Pulse 78   Temp 98.5 F (36.9 C) (Oral)   Resp 18   Ht 4' 11"  (1.499 m)   Wt 50.8 kg   LMP 05/25/2021   SpO2 100%   BMI 22.62 kg/m   Physical Exam Vitals and nursing note reviewed.  Constitutional:      General: She is not in acute distress.    Appearance: She is well-developed. She is not diaphoretic.  HENT:     Head: Normocephalic and atraumatic.  Eyes:     Conjunctiva/sclera: Conjunctivae normal.  Cardiovascular:     Rate and Rhythm: Normal rate and regular rhythm.     Heart sounds: Normal heart sounds. No murmur heard.   No friction rub. No gallop.  Pulmonary:     Effort: Pulmonary effort is normal. No respiratory distress.     Breath sounds: Normal breath sounds. No wheezing or rales.  Abdominal:     General: There is no distension.     Palpations: Abdomen is soft.     Tenderness: There is no abdominal tenderness. There is no guarding.  Genitourinary:    Rectum: Guaiac result negative (brown stool).  Musculoskeletal:         General: No tenderness.     Cervical back: Normal range of motion.  Skin:    General: Skin is warm and dry.     Findings: No erythema or rash.  Neurological:     Mental Status: She is alert and oriented to person, place, and time.    ED Results / Procedures / Treatments   Labs (all labs ordered are listed, but only abnormal results are displayed) Labs Reviewed  VITAMIN B12 - Abnormal; Notable for the following components:  Result Value   Vitamin B-12 169 (*)    All other components within normal limits  IRON AND TIBC - Abnormal; Notable for the following components:   Iron 18 (*)    TIBC 761 (*)    Saturation Ratios 2 (*)    All other components within normal limits  FERRITIN - Abnormal; Notable for the following components:   Ferritin 1 (*)    All other components within normal limits  RETICULOCYTES - Abnormal; Notable for the following components:   Immature Retic Fract 26.7 (*)    All other components within normal limits  CBC WITH DIFFERENTIAL/PLATELET - Abnormal; Notable for the following components:   Hemoglobin 6.3 (*)    HCT 26.8 (*)    MCV 57.5 (*)    MCH 13.5 (*)    MCHC 23.5 (*)    RDW 23.7 (*)    Platelets 523 (*)    nRBC 0.4 (*)    All other components within normal limits  COMPREHENSIVE METABOLIC PANEL - Abnormal; Notable for the following components:   Glucose, Bld 121 (*)    Total Protein 8.9 (*)    All other components within normal limits  FOLATE  POC OCCULT BLOOD, ED  TYPE AND SCREEN  PREPARE RBC (CROSSMATCH)    EKG None  Radiology No results found.  Procedures Procedures   Medications Ordered in ED Medications - No data to display   ED Course  I have reviewed the triage vital signs and the nursing notes.  Pertinent labs & imaging results that were available during my care of the patient were reviewed by me and considered in my medical decision making (see chart for details).    MDM Rules/Calculators/A&P                            50yo female with hx PCOS, hypothyroidism, hypertriglyceridemia, presents with concern for anemia.  Was seen in the [facility] department last night, but they do not have blood available, and instead presented here today.     Her hemoglobin today is 6.3.  She has no history or exam findings that show acute clinically significant gastrointestinal bleed or other source of bleeding.  Labs show deficiency of vitamin B12 and iron.She is requesting iron transfusion, discussed this may be discussed more with outpatient providers, but given anemia at this time recommend blood transfusion.  Discussed risks and benefits of blood transfusion, and she was transfused 1 unit of PRBCs without complication in the emergency department.  Also noted to have mild thrombocytosis.  Given symptoms are chronic and mild, no sf active bleeding, feel she is appropriate for transfusion and outpatient therapy pt desires this as well. Given rx for iron. Patient discharged in stable condition with understanding of reasons to return.   Final Clinical Impression(s) / ED Diagnoses Final diagnoses:  Symptomatic anemia    Rx / DC Orders ED Discharge Orders          Ordered    ferrous sulfate 325 (65 FE) MG tablet  Daily with breakfast,   Status:  Discontinued        05/26/21 1618    ferrous sulfate 325 (65 FE) MG tablet  Daily with breakfast        05/26/21 [place], Markisha Meding, MD 05/27/21 [phone]

## 2021-05-26 NOTE — ED Triage Notes (Signed)
Patient reports that she was called by her physician yesterday and informed of a Hgb-5.8. Patient went to [facility] and patient told them they would go to another ED in the morning.

## 2021-05-26 NOTE — ED Notes (Signed)
Blood bank has a unit of blood ready for this patient, notified Taylor,RN and Abby,RN.

## 2021-05-27 ENCOUNTER — Encounter (HOSPITAL_COMMUNITY): Payer: Self-pay | Admitting: Emergency Medicine

## 2021-05-27 ENCOUNTER — Emergency Department (HOSPITAL_COMMUNITY)
Admission: EM | Admit: 2021-05-27 | Discharge: 2021-05-27 | Disposition: A | Discharge status: Home / Self Care | Payer: Medicaid Other | Attending: Emergency Medicine | Admitting: Emergency Medicine

## 2021-05-27 ENCOUNTER — Emergency Department (HOSPITAL_COMMUNITY): Payer: Medicaid Other

## 2021-05-27 ENCOUNTER — Other Ambulatory Visit: Payer: Self-pay

## 2021-05-27 DIAGNOSIS — Z7984 Long term (current) use of oral hypoglycemic drugs: Secondary | ICD-10-CM | POA: Insufficient documentation

## 2021-05-27 DIAGNOSIS — E119 Type 2 diabetes mellitus without complications: Secondary | ICD-10-CM | POA: Insufficient documentation

## 2021-05-27 DIAGNOSIS — U071 COVID-19: Secondary | ICD-10-CM | POA: Diagnosis not present

## 2021-05-27 DIAGNOSIS — Z794 Long term (current) use of insulin: Secondary | ICD-10-CM | POA: Diagnosis not present

## 2021-05-27 DIAGNOSIS — R531 Weakness: Secondary | ICD-10-CM

## 2021-05-27 DIAGNOSIS — Z79899 Other long term (current) drug therapy: Secondary | ICD-10-CM | POA: Diagnosis not present

## 2021-05-27 DIAGNOSIS — E039 Hypothyroidism, unspecified: Secondary | ICD-10-CM | POA: Insufficient documentation

## 2021-05-27 DIAGNOSIS — D649 Anemia, unspecified: Secondary | ICD-10-CM

## 2021-05-27 LAB — BASIC METABOLIC PANEL
Anion gap: 9 (ref 5–15)
BUN: 12 mg/dL (ref 6–20)
CO2: 23 mmol/L (ref 22–32)
Calcium: 9.5 mg/dL (ref 8.9–10.3)
Chloride: 106 mmol/L (ref 98–111)
Creatinine, Ser: 0.69 mg/dL (ref 0.44–1.00)
GFR, Estimated: >60 mL/min (ref >60)
Glucose, Bld: 180 mg/dL — ABNORMAL HIGH (ref 70–99)
Potassium: 3.8 mmol/L (ref 3.5–5.1)
Sodium: 138 mmol/L (ref 135–145)

## 2021-05-27 LAB — TYPE AND SCREEN
ABO/RH(D): O POS
Antibody Screen: NEGATIVE
Unit division: 0

## 2021-05-27 LAB — CBC WITH DIFFERENTIAL/PLATELET
Abs Immature Granulocytes: 0.02 10*3/uL (ref 0.00–0.07)
Basophils Absolute: 0 10*3/uL (ref 0.0–0.1)
Basophils Relative: 1 %
Eosinophils Absolute: 0.1 10*3/uL (ref 0.0–0.5)
Eosinophils Relative: 2 %
HCT: 29.4 % — ABNORMAL LOW (ref 36.0–46.0)
Hemoglobin: 7.7 g/dL — ABNORMAL LOW (ref 12.0–15.0)
Immature Granulocytes: 0 %
Lymphocytes Relative: 42 %
Lymphs Abs: 2.3 10*3/uL (ref 0.7–4.0)
MCH: 16.5 pg — ABNORMAL LOW (ref 26.0–34.0)
MCHC: 26.2 g/dL — ABNORMAL LOW (ref 30.0–36.0)
MCV: 63 fL — ABNORMAL LOW (ref 80.0–100.0)
Monocytes Absolute: 0.3 10*3/uL (ref 0.1–1.0)
Monocytes Relative: 6 %
Neutro Abs: 2.7 10*3/uL (ref 1.7–7.7)
Neutrophils Relative %: 49 %
Platelets: 445 10*3/uL — ABNORMAL HIGH (ref 150–400)
RBC: 4.67 MIL/uL (ref 3.87–5.11)
RDW: 29.6 % — ABNORMAL HIGH (ref 11.5–15.5)
WBC: 5.6 10*3/uL (ref 4.0–10.5)
nRBC: 0 % (ref 0.0–0.2)

## 2021-05-27 LAB — BPAM RBC
Blood Product Expiration Date: 202207272359
ISSUE DATE / TIME: 202206241239
Unit Type and Rh: 5100

## 2021-05-27 LAB — PREPARE RBC (CROSSMATCH)

## 2021-05-27 MED ORDER — SODIUM CHLORIDE 0.9 % IV SOLN: 10.0000 mL/h | Freq: Once | INTRAVENOUS | Status: DC

## 2021-05-27 MED ORDER — LACTATED RINGERS IV BOLUS
1000.0000 mL | Freq: Once | INTRAVENOUS | Status: AC
  Administered 2021-05-27: 1000 mL via INTRAVENOUS

## 2021-05-27 MED ORDER — LACTATED RINGERS IV SOLN: INTRAVENOUS | Status: DC

## 2021-05-27 NOTE — ED Provider Notes (Signed)
Will be to [facility] DEPT Provider Note   CSN: [id number] Arrival date & time: 05/27/21  1232     History Chief Complaint  Patient presents with   Covid Positive   Fatigue    Mary Logan is a 50 y.o. female.  50 year old female presents with ongoing weakness.  Seen yesterday for same and given 1 unit of blood after was found to have hemoglobin of 6.1.  States she initially did feel better but then now continues to endorse now focal weakness.  Denies any fever or chills.  Patient was diagnosed with COVID recently.  Denies any shortness of breath.  Denies any active blood loss      Past Medical History:  Diagnosis Date   Diabetes mellitus    GDM - insulin   History of PCOS    tx with metformin   Hypertriglyceridemia    diet controlled   Hypothyroidism    Maternal anemia complicating pregnancy, childbirth, or the puerperium 06/24/2012   Postpartum care following cesarean delivery (7/22) 06/24/2012   Pregnancy And Insulin-Dependent Diabetes Mellitus (delivered) 06/24/2012   Thyroid disease     Patient Active Problem List   Diagnosis Date Noted   Symptomatic anemia 05/25/2021   Postpartum care following cesarean delivery (7/22) 06/24/2012   Maternal anemia complicating pregnancy, childbirth, or the puerperium 06/24/2012   Pregnancy And Insulin-Dependent Diabetes Mellitus (delivered) 06/24/2012    Past Surgical History:  Procedure Laterality Date   CESAREAN SECTION     x 2   CESAREAN SECTION  06/23/2012   Procedure: CESAREAN SECTION;  Surgeon: Princess Bruins, MD;  Location: [facility];  Service: Gynecology;  Laterality: N/A;  EDD: 06/29/12     OB History     Gravida  5   Para  3   Term  2   Preterm  1   AB  2   Living  1      SAB  2   IAB      Ectopic      Multiple      Live Births  2           Family History  Problem Relation Age of Onset   Diabetes Mother    Diabetes Brother     Social History   Tobacco  Use   Smoking status: Never   Smokeless tobacco: Never  Vaping Use   Vaping Use: Never used  Substance Use Topics   Alcohol use: No   Drug use: No    Home Medications Prior to Admission medications   Medication Sig Start Date End Date Taking? Authorizing Provider  Accu-Chek Softclix Lancets lancets USE AS DIRECTED ONCE DAILY 09/14/20 09/14/21  Jacelyn Pi, MD  amoxicillin-clavulanate (AUGMENTIN) 500-125 MG tablet TAKE 1 TABLET BY MOUTH EVERY 12 HOURS FOR 7 DAYS 01/11/21 01/11/22  Jacelyn Pi, MD  Blood Glucose Monitoring Suppl (ACCU-CHEK GUIDE) w/Device KIT USE AS DIRECTED 09/14/20 09/14/21  Jacelyn Pi, MD  Empagliflozin-metFORMIN HCl 5-500 MG TABS TAKE 1 TABLET BY MOUTH ONCE A DAY WITH A MEAL. 11/29/20 11/29/21  Jacelyn Pi, MD  fenofibrate 54 MG tablet TAKE 2 TABLETS BY MOUTH ONCE A DAY WITH FOOD 11/15/20 11/15/21  Jacelyn Pi, MD  ferrous sulfate 325 (65 FE) MG tablet Take 1 tablet (325 mg total) by mouth daily with breakfast. 05/26/21   Little, Wenda Overland, MD  glucose blood test strip USE AS DIRECTED TO TEST ONCE A DAILY 09/14/20 09/14/21  Jacelyn Pi, MD  insulin lispro (HUMALOG)  100 UNIT/ML injection Inject 10 Units into the skin daily before breakfast. 06/26/12   Juliene Pina, CNM  insulin lispro (HUMALOG) 100 UNIT/ML injection Inject 10 Units into the skin daily before lunch. 06/26/12   Juliene Pina, CNM  insulin lispro (HUMALOG) 100 UNIT/ML injection Inject 11 Units into the skin daily before supper. 06/26/12   Juliene Pina, CNM  iron polysaccharides (NU-IRON) 150 MG capsule Take 1 capsule (150 mg total) by mouth daily. 06/26/12 06/26/13  Juliene Pina, CNM  levothyroxine (SYNTHROID) 137 MCG tablet TAKE 1 TABLET BY MOUTH ONCE DAILY 02/24/21 02/24/22  Jacelyn Pi, MD  levothyroxine (SYNTHROID, LEVOTHROID) 112 MCG tablet Take 112 mcg by mouth daily.    [provider]  losartan (COZAAR) 25 MG tablet TAKE 1 TABLET BY MOUTH ONCE A DAY 09/14/20  09/14/21  Jacelyn Pi, MD  Misc. Devices (BREAST PUMP) MISC 1 Units by Does not apply route as needed. 06/26/12   Juliene Pina, CNM  omega-3 acid ethyl esters (LOVAZA) 1 G capsule Take 3 g by mouth 2 (two) times daily.    [provider]  Prenatal Vit-Fe Fumarate-FA (PRENATAL VITAMINS PLUS PO) Take 1 tablet by mouth daily.    [provider]    Allergies    Patient has no known allergies.  Review of Systems   Review of Systems  All other systems reviewed and are negative.  Physical Exam Updated Vital Signs BP 113/77   Pulse 81   Temp 98.6 F (37 C)   Resp 16   LMP 05/25/2021   SpO2 99%   Physical Exam Vitals and nursing note reviewed.  Constitutional:      General: She is not in acute distress.    Appearance: Normal appearance. She is well-developed. She is not toxic-appearing.  HENT:     Head: Normocephalic and atraumatic.  Eyes:     General: Lids are normal.     Conjunctiva/sclera: Conjunctivae normal.     Pupils: Pupils are equal, round, and reactive to light.  Neck:     Thyroid: No thyroid mass.     Trachea: No tracheal deviation.  Cardiovascular:     Rate and Rhythm: Normal rate and regular rhythm.     Heart sounds: Normal heart sounds. No murmur heard.   No gallop.  Pulmonary:     Effort: Pulmonary effort is normal. No respiratory distress.     Breath sounds: Normal breath sounds. No stridor. No decreased breath sounds, wheezing, rhonchi or rales.  Abdominal:     General: There is no distension.     Palpations: Abdomen is soft.     Tenderness: There is no abdominal tenderness. There is no rebound.  Musculoskeletal:        General: No tenderness. Normal range of motion.     Cervical back: Normal range of motion and neck supple.  Skin:    General: Skin is warm and dry.     Findings: No abrasion or rash.  Neurological:     Mental Status: She is alert and oriented to person, place, and time. Mental status is at baseline.     GCS: GCS  eye subscore is 4. GCS verbal subscore is 5. GCS motor subscore is 6.     Cranial Nerves: Cranial nerves are intact. No cranial nerve deficit.     Sensory: No sensory deficit.     Motor: Motor function is intact.  Psychiatric:        Attention and Perception: Attention  normal.        Speech: Speech normal.        Behavior: Behavior normal.    ED Results / Procedures / Treatments   Labs (all labs ordered are listed, but only abnormal results are displayed) Labs Reviewed  CBC WITH DIFFERENTIAL/PLATELET  BASIC METABOLIC PANEL  TYPE AND SCREEN    EKG None  Radiology No results found.  Procedures Procedures   Medications Ordered in ED Medications  lactated ringers infusion (has no administration in time range)  lactated ringers bolus 1,000 mL (1,000 mLs Intravenous New Bag/Given 05/27/21 1309)    ED Course  I have reviewed the triage vital signs and the nursing notes.  Pertinent labs & imaging results that were available during my care of the patient were reviewed by me and considered in my medical decision making (see chart for details).    MDM Rules/Calculators/A&P                         Patient given IV fluids here. Hemoglobin noted and will be transfused 1 unit packed red blood cells.  Will be discharged afterwards.  Signout to Dr. Roderic Palau Final Clinical Impression(s) / ED Diagnoses Final diagnoses:  None    Rx / DC Orders ED Discharge Orders     None        Lacretia Leigh, MD 05/27/21 1506

## 2021-05-27 NOTE — Discharge Instructions (Addendum)
Follow-up with your family doctor this week for recheck 

## 2021-05-27 NOTE — ED Triage Notes (Signed)
Patient c/o fatigue, lower back pain and is Covid positive. She reports a hx of iron deficiency anemia and had a blood transfusion yesterday, reporting hemoglobin was 5.8. She was told to return to ED if she continued to feel weak and fatigued.

## 2021-05-28 LAB — TYPE AND SCREEN
ABO/RH(D): O POS
Antibody Screen: NEGATIVE
Unit division: 0

## 2021-05-28 LAB — BPAM RBC
Blood Product Expiration Date: 202207292359
ISSUE DATE / TIME: 202206251512
Unit Type and Rh: 5100

## 2021-05-29 ENCOUNTER — Other Ambulatory Visit: Payer: Self-pay | Admitting: Family Medicine

## 2021-05-29 DIAGNOSIS — Z1231 Encounter for screening mammogram for malignant neoplasm of breast: Secondary | ICD-10-CM

## 2021-05-30 ENCOUNTER — Other Ambulatory Visit: Payer: Self-pay | Admitting: *Deleted

## 2021-05-30 DIAGNOSIS — D649 Anemia, unspecified: Secondary | ICD-10-CM

## 2021-06-01 ENCOUNTER — Telehealth: Payer: Self-pay

## 2021-06-02 ENCOUNTER — Other Ambulatory Visit: Payer: Self-pay

## 2021-06-02 ENCOUNTER — Encounter: Payer: Self-pay | Admitting: Hematology & Oncology

## 2021-06-02 ENCOUNTER — Inpatient Hospital Stay: Payer: Medicaid Other | Attending: Hematology & Oncology

## 2021-06-02 ENCOUNTER — Inpatient Hospital Stay (HOSPITAL_BASED_OUTPATIENT_CLINIC_OR_DEPARTMENT_OTHER): Payer: Medicaid Other | Admitting: Hematology & Oncology

## 2021-06-02 VITALS — BP 123/79 | HR 98 | Temp 99.1°F | Resp 18 | Wt 111.0 lb

## 2021-06-02 DIAGNOSIS — E538 Deficiency of other specified B group vitamins: Secondary | ICD-10-CM | POA: Diagnosis not present

## 2021-06-02 DIAGNOSIS — K909 Intestinal malabsorption, unspecified: Secondary | ICD-10-CM | POA: Insufficient documentation

## 2021-06-02 DIAGNOSIS — E119 Type 2 diabetes mellitus without complications: Secondary | ICD-10-CM | POA: Diagnosis not present

## 2021-06-02 DIAGNOSIS — D508 Other iron deficiency anemias: Secondary | ICD-10-CM | POA: Diagnosis not present

## 2021-06-02 DIAGNOSIS — Z7189 Other specified counseling: Secondary | ICD-10-CM | POA: Diagnosis not present

## 2021-06-02 DIAGNOSIS — Z794 Long term (current) use of insulin: Secondary | ICD-10-CM | POA: Diagnosis not present

## 2021-06-02 DIAGNOSIS — R5383 Other fatigue: Secondary | ICD-10-CM

## 2021-06-02 DIAGNOSIS — D509 Iron deficiency anemia, unspecified: Secondary | ICD-10-CM | POA: Diagnosis not present

## 2021-06-02 DIAGNOSIS — D510 Vitamin B12 deficiency anemia due to intrinsic factor deficiency: Secondary | ICD-10-CM

## 2021-06-02 DIAGNOSIS — D649 Anemia, unspecified: Secondary | ICD-10-CM

## 2021-06-02 DIAGNOSIS — D500 Iron deficiency anemia secondary to blood loss (chronic): Secondary | ICD-10-CM | POA: Insufficient documentation

## 2021-06-02 HISTORY — DX: Intestinal malabsorption, unspecified: K90.9

## 2021-06-02 HISTORY — DX: Iron deficiency anemia secondary to blood loss (chronic): D50.0

## 2021-06-02 HISTORY — DX: Other specified counseling: Z71.89

## 2021-06-02 HISTORY — DX: Vitamin B12 deficiency anemia due to intrinsic factor deficiency: D51.0

## 2021-06-02 LAB — CMP (CANCER CENTER ONLY)
ALT: 12 U/L (ref 0–44)
AST: 20 U/L (ref 15–41)
Albumin: 4.6 g/dL (ref 3.5–5.0)
Alkaline Phosphatase: 39 U/L (ref 38–126)
Anion gap: 10 (ref 5–15)
BUN: 19 mg/dL (ref 6–20)
CO2: 24 mmol/L (ref 22–32)
Calcium: 10.3 mg/dL (ref 8.9–10.3)
Chloride: 101 mmol/L (ref 98–111)
Creatinine: 0.74 mg/dL (ref 0.44–1.00)
GFR, Estimated: >60 mL/min (ref >60)
Glucose, Bld: 103 mg/dL — ABNORMAL HIGH (ref 70–99)
Potassium: 4.2 mmol/L (ref 3.5–5.1)
Sodium: 135 mmol/L (ref 135–145)
Total Bilirubin: 0.4 mg/dL (ref 0.3–1.2)
Total Protein: 7.7 g/dL (ref 6.5–8.1)

## 2021-06-02 LAB — CBC WITH DIFFERENTIAL (CANCER CENTER ONLY)
Abs Immature Granulocytes: 0.04 10*3/uL (ref 0.00–0.07)
Basophils Absolute: 0.1 10*3/uL (ref 0.0–0.1)
Basophils Relative: 1 %
Eosinophils Absolute: 0.1 10*3/uL (ref 0.0–0.5)
Eosinophils Relative: 2 %
HCT: 36.5 % (ref 36.0–46.0)
Hemoglobin: 10 g/dL — ABNORMAL LOW (ref 12.0–15.0)
Immature Granulocytes: 1 %
Lymphocytes Relative: 42 %
Lymphs Abs: 2.1 10*3/uL (ref 0.7–4.0)
MCH: 18.3 pg — ABNORMAL LOW (ref 26.0–34.0)
MCHC: 27.4 g/dL — ABNORMAL LOW (ref 30.0–36.0)
MCV: 66.7 fL — ABNORMAL LOW (ref 80.0–100.0)
Monocytes Absolute: 0.4 10*3/uL (ref 0.1–1.0)
Monocytes Relative: 7 %
Neutro Abs: 2.4 10*3/uL (ref 1.7–7.7)
Neutrophils Relative %: 47 %
Platelet Count: 334 10*3/uL (ref 150–400)
RBC: 5.47 MIL/uL — ABNORMAL HIGH (ref 3.87–5.11)
RDW: 32.4 % — ABNORMAL HIGH (ref 11.5–15.5)
WBC Count: 5.1 10*3/uL (ref 4.0–10.5)
nRBC: 0 % (ref 0.0–0.2)

## 2021-06-02 LAB — IRON AND TIBC
Iron: 44 ug/dL (ref 28–170)
Saturation Ratios: 6 % — ABNORMAL LOW (ref 10.4–31.8)
TIBC: 686 ug/dL — ABNORMAL HIGH (ref 250–450)
UIBC: 642 ug/dL

## 2021-06-02 LAB — SAVE SMEAR(SSMR), FOR PROVIDER SLIDE REVIEW

## 2021-06-02 LAB — FERRITIN: Ferritin: 11 ng/mL (ref 11–307)

## 2021-06-02 LAB — RETICULOCYTES
Immature Retic Fract: 33.3 % — ABNORMAL HIGH (ref 2.3–15.9)
RBC.: 5.39 MIL/uL — ABNORMAL HIGH (ref 3.87–5.11)
Retic Count, Absolute: 65.2 10*3/uL (ref 19.0–186.0)
Retic Ct Pct: 1.2 % (ref 0.4–3.1)

## 2021-06-02 NOTE — Progress Notes (Signed)
Referral MD  Reason for Referral: Microcytic anemia-severe iron deficiency secondary to iron loss  Chief Complaint  Patient presents with   New Patient (Initial Visit)  : I was very anemic.  HPI: Mary Logan is a very nice 50 year old [name] female.  She has diabetes.  This was in her family.  She did have gestational diabetes.  She presented to the emergency room on June 23.  At the time, she is incredibly anemic.  Her labs showed a white cell count of 6.2.  Hemoglobin 5.7.  Platelet count 494,000.  Her MCV was 57.  She says she got 2 units of blood.  She also may have had some iron.  She is on oral iron.  The oral iron tends to constipate her and cause some abdominal issues.  She was profoundly iron deficient.  Her iron studies showed a ferritin of 1 with an iron saturation of 2%.  She says she does not have heavy cycles.  She has not any vegetarian.  She does not chew ice..  There is no history of any type of hemoglobinopathy.  She does not have sickle cell or thalassemia as far she knows.  There is no family history of anemia.  She does not smoke.  She does not drink..  She has 2 daughters.  She may have had some difficulties with their delivery and may have had some bleeding with that.  She does not have any rashes.  Has been no weight loss or weight gain.  She has not noted any change in bowel or bladder habits.  Again she is constipated from the oral iron.  She was referred to the [facility] to see if we can give her IV iron.  Of note, she was also found to have some B12 deficiency.  Her vitamin B12 level was 142.  She is taking some B12 supplements.  Overall, I would say her performance status is ECOG 1.    Past Medical History:  Diagnosis Date   Diabetes mellitus    GDM - insulin   History of PCOS    tx with metformin   Hypertriglyceridemia    diet controlled   Hypothyroidism    Maternal anemia complicating pregnancy, childbirth, or the  puerperium 06/24/2012   Postpartum care following cesarean delivery (7/22) 06/24/2012   Pregnancy And Insulin-Dependent Diabetes Mellitus (delivered) 06/24/2012   Thyroid disease   :   Past Surgical History:  Procedure Laterality Date   CESAREAN SECTION     x 2   CESAREAN SECTION  06/23/2012   Procedure: CESAREAN SECTION;  Surgeon: Princess Bruins, MD;  Location: [facility];  Service: Gynecology;  Laterality: N/A;  EDD: 06/29/12  :   Current Outpatient Medications:    Cyanocobalamin (VITAMIN B12 PO), Take 1 tablet by mouth daily., Disp: , Rfl:    Accu-Chek Softclix Lancets lancets, USE AS DIRECTED ONCE DAILY, Disp: 100 each, Rfl: 4   Blood Glucose Monitoring Suppl (ACCU-CHEK GUIDE) w/Device KIT, USE AS DIRECTED, Disp: 1 kit, Rfl: 0   Empagliflozin-metFORMIN HCl 5-500 MG TABS, TAKE 1 TABLET BY MOUTH ONCE A DAY WITH A MEAL., Disp: 30 tablet, Rfl: 6   ergocalciferol (VITAMIN D2) 1.25 MG (50000 UT) capsule, Take by mouth., Disp: , Rfl:    fenofibrate 54 MG tablet, TAKE 2 TABLETS BY MOUTH ONCE A DAY WITH FOOD, Disp: 180 tablet, Rfl: 3   ferrous sulfate 325 (65 FE) MG tablet, Take 1 tablet (325 mg total) by mouth daily with  breakfast., Disp: 30 tablet, Rfl: 0   glucose blood test strip, USE AS DIRECTED TO TEST ONCE A DAILY, Disp: 100 strip, Rfl: 4   levothyroxine (SYNTHROID) 137 MCG tablet, TAKE 1 TABLET BY MOUTH ONCE DAILY, Disp: 90 tablet, Rfl: 3   losartan (COZAAR) 25 MG tablet, TAKE 1 TABLET BY MOUTH ONCE A DAY, Disp: 30 tablet, Rfl: 6   omega-3 acid ethyl esters (LOVAZA) 1 G capsule, Take 3 g by mouth 2 (two) times daily., Disp: , Rfl: :  :  No Known Allergies:   Family History  Problem Relation Age of Onset   Diabetes Mother    Diabetes Brother   :   Social History   Socioeconomic History   Marital status: Married    Spouse name: Not on file   Number of children: Not on file   Years of education: Not on file   Highest education level: Not on file  Occupational History   Not  on file  Tobacco Use   Smoking status: Never   Smokeless tobacco: Never  Vaping Use   Vaping Use: Never used  Substance and Sexual Activity   Alcohol use: No   Drug use: No   Sexual activity: Yes    Birth control/protection: None    Comment: pregnant  Other Topics Concern   Not on file  Social History Narrative   Not on file   Social Determinants of Health   Financial Resource Strain: Not on file  Food Insecurity: Not on file  Transportation Needs: Not on file  Physical Activity: Not on file  Stress: Not on file  Social Connections: Not on file  Intimate Partner Violence: Not on file  :  Review of Systems  Constitutional:  Positive for malaise/fatigue.  HENT: Negative.    Eyes: Negative.   Respiratory: Negative.    Cardiovascular: Negative.   Gastrointestinal: Negative.   Genitourinary: Negative.   Musculoskeletal: Negative.   Skin: Negative.   Neurological: Negative.   Endo/Heme/Allergies: Negative.   Psychiatric/Behavioral: Negative.      Exam:  This is a well-developed and well-nourished [name] female.  Her vital signs are temperature of 99.1.  Pulse 98.  Blood pressure 123/79.  Weight is 111 pounds.  Head and neck exam shows no ocular or oral lesions.  She has some pale conjunctiva.  There is no adenopathy in the neck.  Her thyroid is nonpalpable.  Lungs are clear bilaterally.  Cardiac exam regular rate and rhythm with no murmurs, rubs or bruits.  Abdomen is soft.  She has good bowel sounds.  There is no fluid wave.  There is no palpable liver or spleen tip.  Back exam shows no tenderness over the spine, ribs or hips.  Extremities shows no clubbing, cyanosis or edema.  She has good range of motion of her joints.  Skin exam shows no rashes, ecchymoses or petechia.  Neurological exam shows no focal neurological deficits. @IPVITALS @    Recent Labs    06/02/21 1158  WBC 5.1  HGB 10.0*  HCT 36.5  PLT 334    Recent Labs    06/02/21 1158  NA 135  K 4.2   CL 101  CO2 24  GLUCOSE 103*  BUN 19  CREATININE 0.74  CALCIUM 10.3    Blood smear review: There is mild anisocytosis and poikilocytosis.  She has microcytic red blood cells.  There are hypochromic red blood cells.  I see no target cells.  She has no schistocytes.  She has  some ovalocytes and elliptocytes.  There are no nucleated red blood cells.  I see no rouleaux formation.  White blood cells are normal in morphology maturation.  She has no hypersegmented polys.  I see no immature myeloid or lymphoid cells.  Platelets are adequate number and size.  Platelets are well granulated.  Pathology: None     Assessment and Plan: Mary Logan is a very nice 50 year old [name] woman.  She was profoundly anemic about a week or so ago.  She is on some oral iron.  She has a hard time with oral iron.  She is profoundly iron deficient.  I am sure that she is still iron deficient.  As such, we will give her IV iron.  Hopefully, her insurance will allow [place] to use Monoferric.  She also has some B12 deficiency.  It is hard to say how much this is involved with the anemia.  However, we will give her a B12 injection while she is here.  I will try to set the iron up for next week.  I would suspect that she should improve nicely.  Her MCV is already coming up with the oral iron.  She has a better MCV and hemoglobin with the oral iron.  I told her that she can stop the oral iron.  Again IV iron will improve her hemoglobin and allow her to have less toxicity.  I spent a good hour with her.  I went over her lab work.  I answered all of her questions.  We will plan to get her back to see [place] in about 6 weeks.  I would expect that her hemoglobin should be over 11.

## 2021-06-06 ENCOUNTER — Telehealth: Payer: Self-pay | Admitting: *Deleted

## 2021-06-06 LAB — HGB FRACTIONATION CASCADE
Hgb A2: 2.1 % (ref 1.8–3.2)
Hgb A: 97.9 % (ref 96.4–98.8)
Hgb F: 0 % (ref 0.0–2.0)
Hgb S: 0 %

## 2021-06-06 NOTE — Telephone Encounter (Signed)
Per 06/02/21 los - called patient and gave upcoming appointment - (1) dose of IV Iron & B-12 injection - patient confirmed

## 2021-06-07 ENCOUNTER — Other Ambulatory Visit: Payer: Self-pay

## 2021-06-07 ENCOUNTER — Inpatient Hospital Stay: Payer: Medicaid Other

## 2021-06-07 VITALS — BP 110/65 | HR 92 | Temp 98.2°F | Resp 17

## 2021-06-07 DIAGNOSIS — D509 Iron deficiency anemia, unspecified: Secondary | ICD-10-CM | POA: Diagnosis not present

## 2021-06-07 DIAGNOSIS — K909 Intestinal malabsorption, unspecified: Secondary | ICD-10-CM

## 2021-06-07 DIAGNOSIS — D500 Iron deficiency anemia secondary to blood loss (chronic): Secondary | ICD-10-CM

## 2021-06-07 MED ORDER — CYANOCOBALAMIN 1000 MCG/ML IJ SOLN
1000.0000 ug | Freq: Once | INTRAMUSCULAR | Status: AC
  Administered 2021-06-07: 1000 ug via INTRAMUSCULAR

## 2021-06-07 MED ORDER — METHYLPREDNISOLONE SODIUM SUCC 125 MG IJ SOLR
INTRAMUSCULAR | Status: AC
  Filled 2021-06-07: qty 2

## 2021-06-07 MED ORDER — FAMOTIDINE 20 MG IN NS 100 ML IVPB
20.0000 mg | Freq: Once | INTRAVENOUS | Status: AC
  Administered 2021-06-07: 20 mg via INTRAVENOUS
  Filled 2021-06-07: qty 100
  Filled 2021-06-07: qty 20

## 2021-06-07 MED ORDER — FAMOTIDINE IN NACL 20-0.9 MG/50ML-% IV SOLN: 20.0000 mg | Freq: Once | INTRAVENOUS | Status: DC

## 2021-06-07 MED ORDER — CYANOCOBALAMIN 1000 MCG/ML IJ SOLN
INTRAMUSCULAR | Status: AC
  Filled 2021-06-07: qty 1

## 2021-06-07 MED ORDER — ACETAMINOPHEN 325 MG PO TABS
650.0000 mg | ORAL_TABLET | Freq: Once | ORAL | Status: AC
  Administered 2021-06-07: 650 mg via ORAL

## 2021-06-07 MED ORDER — METHYLPREDNISOLONE SODIUM SUCC 125 MG IJ SOLR
125.0000 mg | Freq: Once | INTRAMUSCULAR | Status: AC
  Administered 2021-06-07: 125 mg via INTRAVENOUS

## 2021-06-07 MED ORDER — ACETAMINOPHEN 325 MG PO TABS
ORAL_TABLET | ORAL | Status: AC
  Filled 2021-06-07: qty 2

## 2021-06-07 MED ORDER — SODIUM CHLORIDE 0.9 % IV SOLN
1000.0000 mg | Freq: Once | INTRAVENOUS | Status: AC
  Administered 2021-06-07: 1000 mg via INTRAVENOUS
  Filled 2021-06-07: qty 10

## 2021-06-07 MED ORDER — SODIUM CHLORIDE 0.9 % IV SOLN
Freq: Once | INTRAVENOUS | Status: AC
Start: 2021-06-07 — End: 2021-06-07
  Filled 2021-06-07: qty 250

## 2021-06-07 NOTE — Progress Notes (Signed)
Pt declined to stay for full post infusion observation period. Pt stated she had another appointment to get to. Pt aware to call clinic with any questions or concerns. Pt verbalized understanding and had no further questions.

## 2021-06-07 NOTE — Patient Instructions (Signed)
Ferric Derisomaltose injection What is this medication? FERRIC DERISOMALTOSE (ferr-ik der eye soe mawl tose) is an iron complex. Iron is used to make healthy red blood cells, which carry oxygen and nutrients through the body. This medicine is used to treat people with low levels of ironin the blood and have kidney disease or cannot take iron by mouth. This medicine may be used for other purposes; ask your health care provider orpharmacist if you have questions. COMMON BRAND NAME(S): MONOFERRIC What should I tell my care team before I take this medication? They need to know if you have any of these conditions: high levels of iron in the blood an unusual or allergic reaction to iron, other medicines, foods, dyes, or preservatives pregnant or trying to get pregnant breast-feeding How should I use this medication? This medicine is for injection into a vein. It is given by a health careprofessional in a hospital or clinic setting. Talk to your pediatrician regarding the use of this medicine in children.Special care may be needed. Overdosage: If you think you have taken too much of this medicine contact apoison control center or emergency room at once. NOTE: This medicine is only for you. Do not share this medicine with others. What if I miss a dose? It is important not to miss your dose. Call your doctor or health careprofessional if you are unable to keep an appointment. What may interact with this medication? Do not take this medicine with any of the following medications: deferoxamine dimercaprol other iron products This list may not describe all possible interactions. Give your health care provider a list of all the medicines, herbs, non-prescription drugs, or dietary supplements you use. Also tell them if you smoke, drink alcohol, or use illegaldrugs. Some items may interact with your medicine. What should I watch for while using this medication? Visit your doctor or health care professional  regularly. Tell your doctor if your symptoms do not start to get better or if they get worse. You may needblood work done while you are taking this medicine. You may need to follow a special diet. Talk to your doctor. Foods that contain iron include whole grains/cereals, dried fruits, beans, or peas, leafy greenvegetables, and organ meats (liver, kidney). What side effects may I notice from receiving this medication? Side effects that you should report to your doctor or health care professionalas soon as possible: allergic reactions like skin rash, itching or hives, swelling of the face, lips, or tongue Side effects that usually do not require medical attention (report these toyour doctor or health care professional if they continue or are bothersome): constipation nausea, vomiting pain, redness, or irritation at site where injected This list may not describe all possible side effects. Call your doctor for medical advice about side effects. You may report side effects to FDA at1-800-FDA-1088. Where should I keep my medication? This drug is given in a hospital or clinic and will not be stored at home. NOTE: This sheet is a summary. It may not cover all possible information. If you have questions about this medicine, talk to your doctor, pharmacist, orhealth care provider.  2022 Elsevier/Gold Standard (2019-07-29 15:54:40) 

## 2021-06-16 ENCOUNTER — Other Ambulatory Visit: Payer: Self-pay

## 2021-06-16 ENCOUNTER — Ambulatory Visit
Admission: RE | Admit: 2021-06-16 | Discharge: 2021-06-16 | Disposition: A | Discharge status: Home / Self Care | Payer: Medicaid Other | Source: Ambulatory Visit | Attending: Family Medicine | Admitting: Family Medicine

## 2021-06-16 ENCOUNTER — Encounter: Payer: Self-pay | Admitting: Hematology & Oncology

## 2021-06-16 DIAGNOSIS — Z1231 Encounter for screening mammogram for malignant neoplasm of breast: Secondary | ICD-10-CM

## 2021-06-19 ENCOUNTER — Other Ambulatory Visit (HOSPITAL_COMMUNITY): Payer: Self-pay

## 2021-06-19 MED FILL — Fenofibrate Tab 54 MG: ORAL | 90 days supply | Qty: 180 | Fill #2 | Status: CN

## 2021-06-19 MED FILL — Levothyroxine Sodium Tab 137 MCG: ORAL | 90 days supply | Qty: 90 | Fill #2 | Status: CN

## 2021-06-19 MED FILL — Losartan Potassium Tab 25 MG: ORAL | 30 days supply | Qty: 30 | Fill #2 | Status: AC

## 2021-06-19 MED FILL — Empagliflozin-Metformin HCl Tab 5-500 MG: ORAL | 30 days supply | Qty: 30 | Fill #3 | Status: AC

## 2021-07-14 ENCOUNTER — Inpatient Hospital Stay: Payer: Medicaid Other | Admitting: Hematology & Oncology

## 2021-07-14 ENCOUNTER — Encounter: Payer: Self-pay | Admitting: *Deleted

## 2021-07-14 ENCOUNTER — Encounter: Payer: Self-pay | Admitting: Hematology & Oncology

## 2021-07-14 ENCOUNTER — Inpatient Hospital Stay: Payer: Medicaid Other | Attending: Hematology & Oncology

## 2021-07-14 ENCOUNTER — Inpatient Hospital Stay: Payer: Medicaid Other

## 2021-07-14 ENCOUNTER — Other Ambulatory Visit: Payer: Self-pay

## 2021-07-14 VITALS — BP 122/78 | HR 84 | Temp 98.7°F | Resp 16 | Wt 115.0 lb

## 2021-07-14 DIAGNOSIS — D500 Iron deficiency anemia secondary to blood loss (chronic): Secondary | ICD-10-CM

## 2021-07-14 DIAGNOSIS — E119 Type 2 diabetes mellitus without complications: Secondary | ICD-10-CM | POA: Diagnosis not present

## 2021-07-14 DIAGNOSIS — D509 Iron deficiency anemia, unspecified: Secondary | ICD-10-CM | POA: Insufficient documentation

## 2021-07-14 DIAGNOSIS — K909 Intestinal malabsorption, unspecified: Secondary | ICD-10-CM

## 2021-07-14 DIAGNOSIS — D510 Vitamin B12 deficiency anemia due to intrinsic factor deficiency: Secondary | ICD-10-CM | POA: Insufficient documentation

## 2021-07-14 DIAGNOSIS — Z7984 Long term (current) use of oral hypoglycemic drugs: Secondary | ICD-10-CM | POA: Diagnosis not present

## 2021-07-14 LAB — CBC WITH DIFFERENTIAL (CANCER CENTER ONLY)
Abs Immature Granulocytes: 0.01 10*3/uL (ref 0.00–0.07)
Basophils Absolute: 0 10*3/uL (ref 0.0–0.1)
Basophils Relative: 1 %
Eosinophils Absolute: 0.2 10*3/uL (ref 0.0–0.5)
Eosinophils Relative: 4 %
HCT: 41 % (ref 36.0–46.0)
Hemoglobin: 13.6 g/dL (ref 12.0–15.0)
Immature Granulocytes: 0 %
Lymphocytes Relative: 35 %
Lymphs Abs: 2 10*3/uL (ref 0.7–4.0)
MCH: 26.9 pg (ref 26.0–34.0)
MCHC: 33.2 g/dL (ref 30.0–36.0)
MCV: 81.2 fL (ref 80.0–100.0)
Monocytes Absolute: 0.5 10*3/uL (ref 0.1–1.0)
Monocytes Relative: 8 %
Neutro Abs: 2.9 10*3/uL (ref 1.7–7.7)
Neutrophils Relative %: 52 %
Platelet Count: 301 10*3/uL (ref 150–400)
RBC: 5.05 MIL/uL (ref 3.87–5.11)
WBC Count: 5.6 10*3/uL (ref 4.0–10.5)
nRBC: 0 % (ref 0.0–0.2)

## 2021-07-14 LAB — CMP (CANCER CENTER ONLY)
ALT: 18 U/L (ref 0–44)
AST: 20 U/L (ref 15–41)
Albumin: 4.2 g/dL (ref 3.5–5.0)
Alkaline Phosphatase: 57 U/L (ref 38–126)
Anion gap: 8 (ref 5–15)
BUN: 18 mg/dL (ref 6–20)
CO2: 24 mmol/L (ref 22–32)
Calcium: 9.4 mg/dL (ref 8.9–10.3)
Chloride: 104 mmol/L (ref 98–111)
Creatinine: 0.71 mg/dL (ref 0.44–1.00)
GFR, Estimated: >60 mL/min (ref >60)
Glucose, Bld: 141 mg/dL — ABNORMAL HIGH (ref 70–99)
Potassium: 4.3 mmol/L (ref 3.5–5.1)
Sodium: 136 mmol/L (ref 135–145)
Total Bilirubin: 0.4 mg/dL (ref 0.3–1.2)
Total Protein: 6.8 g/dL (ref 6.5–8.1)

## 2021-07-14 LAB — IRON AND TIBC
Iron: 96 ug/dL (ref 41–142)
Saturation Ratios: 28 % (ref 21–57)
TIBC: 339 ug/dL (ref 236–444)
UIBC: 243 ug/dL (ref 120–384)

## 2021-07-14 LAB — VITAMIN B12: Vitamin B-12: 216 pg/mL (ref 180–914)

## 2021-07-14 LAB — RETICULOCYTES
Immature Retic Fract: 4.4 % (ref 2.3–15.9)
RBC.: 5.05 MIL/uL (ref 3.87–5.11)
Retic Count, Absolute: 60.1 10*3/uL (ref 19.0–186.0)
Retic Ct Pct: 1.2 % (ref 0.4–3.1)

## 2021-07-14 LAB — FERRITIN: Ferritin: 105 ng/mL (ref 11–307)

## 2021-07-14 MED ORDER — CYANOCOBALAMIN 1000 MCG/ML IJ SOLN
1000.0000 ug | Freq: Once | INTRAMUSCULAR | Status: AC
Start: 2021-07-14 — End: 2021-07-14
  Administered 2021-07-14: 1000 ug via INTRAMUSCULAR
  Filled 2021-07-14: qty 1

## 2021-07-14 NOTE — Progress Notes (Signed)
Hematology and Oncology Follow Up Visit  Mary Logan [id number] 04/27/1971 50 y.o. 07/14/2021   Principle Diagnosis:  Iron deficiency anemia-menometrorrhagia Pernicious anemia  Current Therapy:   IV iron-Monoferric given on 06/07/2021 Vitamin B12 1000 mcg IM monthly     Interim History:  Mary Logan is back for follow-up.  This is her second office visit.  We first saw her back in early July.  At that time, she was quite anemic.  Her ferritin was 11 with iron saturation of only 6%.  Surprisingly, her vitamin B12 was 169.  She got Monoferric.  She got vitamin B12.  She is feeling a whole lot better.  Her hemoglobin is come up to 13.6 now.  She still has her monthly cycles.  There is still somewhat heavy.  She has had no problems with eating.  She is diabetic.  She does have to watch what she eats.  She has had no obvious change in bowel or bladder habits.  She has had no cough or shortness of breath.  There is been no issues with COVID.  Overall, I would say performance status is ECOG 1.  Medications:  Current Outpatient Medications:    Accu-Chek Softclix Lancets lancets, USE AS DIRECTED ONCE DAILY, Disp: 100 each, Rfl: 4   Blood Glucose Monitoring Suppl (ACCU-CHEK GUIDE) w/Device KIT, USE AS DIRECTED, Disp: 1 kit, Rfl: 0   Cyanocobalamin (VITAMIN B12 PO), Take 1 tablet by mouth daily., Disp: , Rfl:    Empagliflozin-metFORMIN HCl 5-500 MG TABS, TAKE 1 TABLET BY MOUTH ONCE A DAY WITH A MEAL., Disp: 30 tablet, Rfl: 6   ergocalciferol (VITAMIN D2) 1.25 MG (50000 UT) capsule, Take by mouth., Disp: , Rfl:    fenofibrate 54 MG tablet, TAKE 2 TABLETS BY MOUTH ONCE A DAY WITH FOOD, Disp: 180 tablet, Rfl: 3   glucose blood test strip, USE AS DIRECTED TO TEST ONCE A DAILY, Disp: 100 strip, Rfl: 4   levothyroxine (SYNTHROID) 137 MCG tablet, TAKE 1 TABLET BY MOUTH ONCE DAILY, Disp: 90 tablet, Rfl: 3   losartan (COZAAR) 25 MG tablet, TAKE 1 TABLET BY MOUTH ONCE A DAY, Disp: 30 tablet, Rfl: 6    omega-3 acid ethyl esters (LOVAZA) 1 G capsule, Take 3 g by mouth 2 (two) times daily., Disp: , Rfl:   Allergies: No Known Allergies  Past Medical History, Surgical history, Social history, and Family History were reviewed and updated.  Review of Systems: Review of Systems  Constitutional: Negative.   HENT:  Negative.    Eyes: Negative.   Respiratory: Negative.    Cardiovascular: Negative.   Gastrointestinal: Negative.   Endocrine: Negative.   Genitourinary: Negative.    Musculoskeletal: Negative.   Skin: Negative.   Neurological: Negative.   Hematological: Negative.   Psychiatric/Behavioral: Negative.     Physical Exam:  weight is 115 lb (52.2 kg). Her oral temperature is 98.7 F (37.1 C). Her blood pressure is 122/78 and her pulse is 84. Her respiration is 16.   Wt Readings from Last 3 Encounters:  07/14/21 115 lb (52.2 kg)  06/02/21 111 lb (50.3 kg)  05/26/21 112 lb (50.8 kg)    Physical Exam Vitals reviewed.  HENT:     Head: Normocephalic and atraumatic.  Eyes:     Pupils: Pupils are equal, round, and reactive to light.  Cardiovascular:     Rate and Rhythm: Normal rate and regular rhythm.     Heart sounds: Normal heart sounds.  Pulmonary:     Effort:  Pulmonary effort is normal.     Breath sounds: Normal breath sounds.  Abdominal:     General: Bowel sounds are normal.     Palpations: Abdomen is soft.  Musculoskeletal:        General: No tenderness or deformity. Normal range of motion.     Cervical back: Normal range of motion.  Lymphadenopathy:     Cervical: No cervical adenopathy.  Skin:    General: Skin is warm and dry.     Findings: No erythema or rash.  Neurological:     Mental Status: She is alert and oriented to person, place, and time.  Psychiatric:        Behavior: Behavior normal.        Thought Content: Thought content normal.        Judgment: Judgment normal.     Lab Results  Component Value Date   WBC 5.6 07/14/2021   HGB 13.6  07/14/2021   HCT 41.0 07/14/2021   MCV 81.2 07/14/2021   PLT 301 07/14/2021     Chemistry      Component Value Date/Time   NA 136 07/14/2021 0905   K 4.3 07/14/2021 0905   CL 104 07/14/2021 0905   CO2 24 07/14/2021 0905   BUN 18 07/14/2021 0905   CREATININE 0.71 07/14/2021 0905      Component Value Date/Time   CALCIUM 9.4 07/14/2021 0905   ALKPHOS 57 07/14/2021 0905   AST 20 07/14/2021 0905   ALT 18 07/14/2021 0905   BILITOT 0.4 07/14/2021 0905      Impression and Plan: Mary Logan is a very charming 50 year old [name] female.  She has both iron deficiency and pernicious anemia.  She has responded incredibly well to both interventions with IV iron and B12.  I am just happy that she is doing a bit better.  Her quality of life is better.  We will go ahead and give her vitamin B12 today.  We will have to see what her iron levels are.  Her MCV is still a little on the lower side 81.  However this is come up incredibly well.  I did agree to mention that we did do a hemoglobin electrophoresis on her.  She does have a normal hemoglobin.  I would like to get her back in October now.  I want to try to make sure we get her back before all of the holidays and says she will be able to enjoy that time a year.   Volanda Napoleon, MD 8/12/20229:43 AM

## 2021-07-14 NOTE — Patient Instructions (Signed)

## 2021-07-25 ENCOUNTER — Other Ambulatory Visit (HOSPITAL_COMMUNITY): Payer: Self-pay

## 2021-07-25 MED FILL — Empagliflozin-Metformin HCl Tab 5-500 MG: ORAL | 30 days supply | Qty: 30 | Fill #4 | Status: AC

## 2021-07-26 ENCOUNTER — Other Ambulatory Visit (HOSPITAL_COMMUNITY): Payer: Self-pay

## 2021-08-08 ENCOUNTER — Other Ambulatory Visit (HOSPITAL_COMMUNITY): Payer: Self-pay

## 2021-08-08 MED FILL — Fenofibrate Tab 54 MG: ORAL | 90 days supply | Qty: 180 | Fill #2 | Status: AC

## 2021-08-09 ENCOUNTER — Other Ambulatory Visit (HOSPITAL_COMMUNITY): Payer: Self-pay

## 2021-08-09 MED ORDER — SYNJARDY 5-500 MG PO TABS
ORAL_TABLET | ORAL | 6 refills | Status: DC
  Filled 2021-08-09 – 2021-12-19 (×2): qty 30, 30d supply, fill #0

## 2021-08-10 ENCOUNTER — Other Ambulatory Visit (HOSPITAL_COMMUNITY): Payer: Self-pay

## 2021-08-10 MED ORDER — LEVOTHYROXINE SODIUM 137 MCG PO TABS
ORAL_TABLET | ORAL | 2 refills | Status: DC
  Filled 2021-08-10 – 2021-08-28 (×2): qty 90, 90d supply, fill #0
  Filled 2021-11-10: qty 90, 90d supply, fill #1
  Filled 2022-04-11: qty 30, 30d supply, fill #2
  Filled 2022-04-23: qty 90, 90d supply, fill #2
  Filled 2022-04-23: qty 30, 30d supply, fill #2
  Filled 2022-05-29: qty 30, 30d supply, fill #3
  Filled 2022-07-09: qty 30, 30d supply, fill #4

## 2021-08-10 MED ORDER — LOSARTAN POTASSIUM 25 MG PO TABS
ORAL_TABLET | ORAL | 2 refills | Status: DC
  Filled 2021-08-10: qty 90, 90d supply, fill #0

## 2021-08-10 MED ORDER — SYNJARDY 5-500 MG PO TABS
ORAL_TABLET | ORAL | 2 refills | Status: DC
  Filled 2021-08-10 – 2021-08-28 (×2): qty 90, 90d supply, fill #0

## 2021-08-10 MED ORDER — LOSARTAN POTASSIUM 25 MG PO TABS
ORAL_TABLET | ORAL | 4 refills | Status: DC
  Filled 2021-08-10: qty 90, 90d supply, fill #0
  Filled 2021-11-03: qty 90, 90d supply, fill #1
  Filled 2022-04-11: qty 30, 30d supply, fill #2
  Filled 2022-05-29: qty 30, 30d supply, fill #3
  Filled 2022-08-07: qty 30, 30d supply, fill #4

## 2021-08-11 ENCOUNTER — Other Ambulatory Visit (HOSPITAL_COMMUNITY): Payer: Self-pay

## 2021-08-17 ENCOUNTER — Other Ambulatory Visit (HOSPITAL_COMMUNITY): Payer: Self-pay

## 2021-08-25 ENCOUNTER — Other Ambulatory Visit (HOSPITAL_COMMUNITY): Payer: Self-pay

## 2021-08-28 ENCOUNTER — Other Ambulatory Visit (HOSPITAL_COMMUNITY): Payer: Self-pay

## 2021-09-15 ENCOUNTER — Inpatient Hospital Stay: Payer: Medicaid Other

## 2021-09-15 ENCOUNTER — Other Ambulatory Visit: Payer: Self-pay

## 2021-09-15 ENCOUNTER — Encounter: Payer: Self-pay | Admitting: Hematology & Oncology

## 2021-09-15 ENCOUNTER — Inpatient Hospital Stay: Payer: Medicaid Other | Attending: Hematology & Oncology

## 2021-09-15 ENCOUNTER — Encounter: Payer: Self-pay | Admitting: *Deleted

## 2021-09-15 ENCOUNTER — Inpatient Hospital Stay (HOSPITAL_BASED_OUTPATIENT_CLINIC_OR_DEPARTMENT_OTHER): Payer: Medicaid Other | Admitting: Hematology & Oncology

## 2021-09-15 VITALS — BP 132/72 | HR 80 | Temp 98.1°F | Resp 18 | Wt 117.0 lb

## 2021-09-15 DIAGNOSIS — D510 Vitamin B12 deficiency anemia due to intrinsic factor deficiency: Secondary | ICD-10-CM | POA: Diagnosis not present

## 2021-09-15 DIAGNOSIS — D500 Iron deficiency anemia secondary to blood loss (chronic): Secondary | ICD-10-CM

## 2021-09-15 DIAGNOSIS — D509 Iron deficiency anemia, unspecified: Secondary | ICD-10-CM | POA: Insufficient documentation

## 2021-09-15 DIAGNOSIS — K909 Intestinal malabsorption, unspecified: Secondary | ICD-10-CM

## 2021-09-15 LAB — CMP (CANCER CENTER ONLY)
ALT: 23 U/L (ref 0–44)
AST: 27 U/L (ref 15–41)
Albumin: 4.3 g/dL (ref 3.5–5.0)
Alkaline Phosphatase: 69 U/L (ref 38–126)
Anion gap: 9 (ref 5–15)
BUN: 14 mg/dL (ref 6–20)
CO2: 25 mmol/L (ref 22–32)
Calcium: 9.9 mg/dL (ref 8.9–10.3)
Chloride: 98 mmol/L (ref 98–111)
Creatinine: 0.65 mg/dL (ref 0.44–1.00)
GFR, Estimated: >60 mL/min (ref >60)
Glucose, Bld: 177 mg/dL — ABNORMAL HIGH (ref 70–99)
Potassium: 4 mmol/L (ref 3.5–5.1)
Sodium: 132 mmol/L — ABNORMAL LOW (ref 135–145)
Total Bilirubin: 0.5 mg/dL (ref 0.3–1.2)
Total Protein: 7 g/dL (ref 6.5–8.1)

## 2021-09-15 LAB — CBC WITH DIFFERENTIAL (CANCER CENTER ONLY)
Abs Immature Granulocytes: 0.01 10*3/uL (ref 0.00–0.07)
Basophils Absolute: 0 10*3/uL (ref 0.0–0.1)
Basophils Relative: 1 %
Eosinophils Absolute: 0.4 10*3/uL (ref 0.0–0.5)
Eosinophils Relative: 6 %
HCT: 42 % (ref 36.0–46.0)
Hemoglobin: 15 g/dL (ref 12.0–15.0)
Immature Granulocytes: 0 %
Lymphocytes Relative: 34 %
Lymphs Abs: 2.3 10*3/uL (ref 0.7–4.0)
MCH: 30.2 pg (ref 26.0–34.0)
MCHC: 35.7 g/dL (ref 30.0–36.0)
MCV: 84.7 fL (ref 80.0–100.0)
Monocytes Absolute: 0.4 10*3/uL (ref 0.1–1.0)
Monocytes Relative: 6 %
Neutro Abs: 3.6 10*3/uL (ref 1.7–7.7)
Neutrophils Relative %: 53 %
Platelet Count: 268 10*3/uL (ref 150–400)
RBC: 4.96 MIL/uL (ref 3.87–5.11)
RDW: 12.4 % (ref 11.5–15.5)
WBC Count: 6.8 10*3/uL (ref 4.0–10.5)
nRBC: 0 % (ref 0.0–0.2)

## 2021-09-15 LAB — RETICULOCYTES
Immature Retic Fract: 10.1 % (ref 2.3–15.9)
RBC.: 4.91 MIL/uL (ref 3.87–5.11)
Retic Count, Absolute: 89.9 10*3/uL (ref 19.0–186.0)
Retic Ct Pct: 1.8 % (ref 0.4–3.1)

## 2021-09-15 LAB — IRON AND TIBC
Iron: 68 ug/dL (ref 28–170)
Saturation Ratios: 18 % (ref 10.4–31.8)
TIBC: 386 ug/dL (ref 250–450)
UIBC: 318 ug/dL

## 2021-09-15 LAB — FERRITIN: Ferritin: 48 ng/mL (ref 11–307)

## 2021-09-15 LAB — VITAMIN B12: Vitamin B-12: 163 pg/mL — ABNORMAL LOW (ref 180–914)

## 2021-09-15 MED ORDER — CYANOCOBALAMIN 1000 MCG/ML IJ SOLN
1000.0000 ug | Freq: Once | INTRAMUSCULAR | Status: AC
  Administered 2021-09-15: 1000 ug via INTRAMUSCULAR
  Filled 2021-09-15: qty 1

## 2021-09-15 NOTE — Patient Instructions (Signed)

## 2021-09-15 NOTE — Progress Notes (Signed)
Hematology and Oncology Follow Up Visit  Mary Logan [id number] July 09, 1971 50 y.o. 09/15/2021   Principle Diagnosis:  Iron deficiency anemia-menometrorrhagia Pernicious anemia  Current Therapy:   IV iron-Monoferric given on 06/07/2021 Vitamin B12 1000 mcg IM monthly     Interim History:  Mary Logan is back for follow-up.  She looks wonderful.  She has responded incredibly well to IV iron.  Her hemoglobin is back up to normal.  The MCV is now 85..  She still gets vitamin B12 injections.  She is doing well with this..  She has had no problems with bleeding.  She still has her monthly cycles.  As such, we will have to still watch her iron levels.  She has had no fever.  There is been no cough or shortness of breath.  She has had no mouth sores.  She has had no leg swelling.  There is been no issues with cough.  She has had no headache.  Her last iron studies back in August showed a ferritin of 105 with an iron saturation of 28%.  Her last vitamin B12 level was 216.  It will be interesting to see what it is now.  Overall, I would say her performance status is ECOG 0.    Medications:  Current Outpatient Medications:    Cyanocobalamin (VITAMIN B12 PO), Take 1 tablet by mouth daily., Disp: , Rfl:    Empagliflozin-metFORMIN HCl (SYNJARDY) 5-500 MG TABS, TAKE 1 TABLET BY MOUTH ONCE A DAY WITH A MEAL., Disp: 30 tablet, Rfl: 6   Empagliflozin-metFORMIN HCl (SYNJARDY) 5-500 MG TABS, TAKE 1 TABLET BY MOUTH ONCE A DAY WITH A MEAL., Disp: 90 tablet, Rfl: 2   ergocalciferol (VITAMIN D2) 1.25 MG (50000 UT) capsule, Take by mouth., Disp: , Rfl:    fenofibrate 54 MG tablet, TAKE 2 TABLETS BY MOUTH ONCE A DAY WITH FOOD, Disp: 180 tablet, Rfl: 3   levothyroxine (SYNTHROID) 137 MCG tablet, TAKE 1 TABLET BY MOUTH ONCE DAILY, Disp: 90 tablet, Rfl: 3   levothyroxine (SYNTHROID) 137 MCG tablet, TAKE 1 TABLET BY MOUTH ONCE DAILY, Disp: 90 tablet, Rfl: 2   losartan (COZAAR) 25 MG tablet, TAKE 1 TABLET BY  MOUTH ONCE A DAY, Disp: 30 tablet, Rfl: 6   losartan (COZAAR) 25 MG tablet, Take 1 tablet by mouth once a day, Disp: 90 tablet, Rfl: 4   losartan (COZAAR) 25 MG tablet, Take 1 tablet by mouth once a day, Disp: 90 tablet, Rfl: 2   omega-3 acid ethyl esters (LOVAZA) 1 G capsule, Take 3 g by mouth 2 (two) times daily., Disp: , Rfl:   Allergies: No Known Allergies  Past Medical History, Surgical history, Social history, and Family History were reviewed and updated.  Review of Systems: Review of Systems  Constitutional: Negative.   HENT:  Negative.    Eyes: Negative.   Respiratory: Negative.    Cardiovascular: Negative.   Gastrointestinal: Negative.   Endocrine: Negative.   Genitourinary: Negative.    Musculoskeletal: Negative.   Skin: Negative.   Neurological: Negative.   Hematological: Negative.   Psychiatric/Behavioral: Negative.     Physical Exam:  weight is 117 lb (53.1 kg). Her oral temperature is 98.1 F (36.7 C). Her blood pressure is 132/72 and her pulse is 80. Her respiration is 18 and oxygen saturation is 98%.   Wt Readings from Last 3 Encounters:  09/15/21 117 lb (53.1 kg)  07/14/21 115 lb (52.2 kg)  06/02/21 111 lb (50.3 kg)    Physical Exam  Vitals reviewed.  HENT:     Head: Normocephalic and atraumatic.  Eyes:     Pupils: Pupils are equal, round, and reactive to light.  Cardiovascular:     Rate and Rhythm: Normal rate and regular rhythm.     Heart sounds: Normal heart sounds.  Pulmonary:     Effort: Pulmonary effort is normal.     Breath sounds: Normal breath sounds.  Abdominal:     General: Bowel sounds are normal.     Palpations: Abdomen is soft.  Musculoskeletal:        General: No tenderness or deformity. Normal range of motion.     Cervical back: Normal range of motion.  Lymphadenopathy:     Cervical: No cervical adenopathy.  Skin:    General: Skin is warm and dry.     Findings: No erythema or rash.  Neurological:     Mental Status: She is  alert and oriented to person, place, and time.  Psychiatric:        Behavior: Behavior normal.        Thought Content: Thought content normal.        Judgment: Judgment normal.     Lab Results  Component Value Date   WBC 6.8 09/15/2021   HGB 15.0 09/15/2021   HCT 42.0 09/15/2021   MCV 84.7 09/15/2021   PLT 268 09/15/2021     Chemistry      Component Value Date/Time   NA 132 (L) 09/15/2021 0904   K 4.0 09/15/2021 0904   CL 98 09/15/2021 0904   CO2 25 09/15/2021 0904   BUN 14 09/15/2021 0904   CREATININE 0.65 09/15/2021 0904      Component Value Date/Time   CALCIUM 9.9 09/15/2021 0904   ALKPHOS 69 09/15/2021 0904   AST 27 09/15/2021 0904   ALT 23 09/15/2021 0904   BILITOT 0.5 09/15/2021 0904      Impression and Plan:  Mary Logan is a very charming 50 year old [place] female.  She has both iron deficiency and pernicious anemia.  She has responded incredibly well to both interventions with IV iron and B12.  I am just happy that she is doing a bit better.  Her quality of life is better.  At this point, we will just have her come in for the B12 injection monthly.  Again because of her monthly cycle, we do have to watch her iron levels.  I will have her come back after the holiday season.    [name], MD 10/14/202210:07 AM

## 2021-10-16 ENCOUNTER — Inpatient Hospital Stay: Payer: Medicaid Other

## 2021-10-16 ENCOUNTER — Other Ambulatory Visit: Payer: Self-pay | Admitting: Family

## 2021-10-16 ENCOUNTER — Other Ambulatory Visit: Payer: Self-pay

## 2021-10-16 ENCOUNTER — Inpatient Hospital Stay: Payer: Medicaid Other | Attending: Hematology & Oncology

## 2021-10-16 VITALS — BP 122/74 | HR 83 | Temp 98.5°F | Resp 18

## 2021-10-16 DIAGNOSIS — D510 Vitamin B12 deficiency anemia due to intrinsic factor deficiency: Secondary | ICD-10-CM | POA: Diagnosis not present

## 2021-10-16 DIAGNOSIS — D500 Iron deficiency anemia secondary to blood loss (chronic): Secondary | ICD-10-CM

## 2021-10-16 DIAGNOSIS — D509 Iron deficiency anemia, unspecified: Secondary | ICD-10-CM | POA: Insufficient documentation

## 2021-10-16 DIAGNOSIS — K909 Intestinal malabsorption, unspecified: Secondary | ICD-10-CM

## 2021-10-16 LAB — CBC WITH DIFFERENTIAL (CANCER CENTER ONLY)
Abs Immature Granulocytes: 0.08 10*3/uL — ABNORMAL HIGH (ref 0.00–0.07)
Basophils Absolute: 0 10*3/uL (ref 0.0–0.1)
Basophils Relative: 1 %
Eosinophils Absolute: 0.4 10*3/uL (ref 0.0–0.5)
Eosinophils Relative: 4 %
HCT: 41.3 % (ref 36.0–46.0)
Hemoglobin: 14.3 g/dL (ref 12.0–15.0)
Immature Granulocytes: 1 %
Lymphocytes Relative: 37 %
Lymphs Abs: 3.2 10*3/uL (ref 0.7–4.0)
MCH: 29.5 pg (ref 26.0–34.0)
MCHC: 34.6 g/dL (ref 30.0–36.0)
MCV: 85.3 fL (ref 80.0–100.0)
Monocytes Absolute: 0.6 10*3/uL (ref 0.1–1.0)
Monocytes Relative: 8 %
Neutro Abs: 4.3 10*3/uL (ref 1.7–7.7)
Neutrophils Relative %: 49 %
Platelet Count: 290 10*3/uL (ref 150–400)
RBC: 4.84 MIL/uL (ref 3.87–5.11)
RDW: 12.5 % (ref 11.5–15.5)
WBC Count: 8.6 10*3/uL (ref 4.0–10.5)
nRBC: 0 % (ref 0.0–0.2)

## 2021-10-16 LAB — CMP (CANCER CENTER ONLY)
ALT: 16 U/L (ref 0–44)
AST: 17 U/L (ref 15–41)
Albumin: 4.5 g/dL (ref 3.5–5.0)
Alkaline Phosphatase: 67 U/L (ref 38–126)
Anion gap: 9 (ref 5–15)
BUN: 21 mg/dL — ABNORMAL HIGH (ref 6–20)
CO2: 24 mmol/L (ref 22–32)
Calcium: 9.9 mg/dL (ref 8.9–10.3)
Chloride: 103 mmol/L (ref 98–111)
Creatinine: 0.67 mg/dL (ref 0.44–1.00)
GFR, Estimated: >60 mL/min (ref >60)
Glucose, Bld: 115 mg/dL — ABNORMAL HIGH (ref 70–99)
Potassium: 3.9 mmol/L (ref 3.5–5.1)
Sodium: 136 mmol/L (ref 135–145)
Total Bilirubin: 0.4 mg/dL (ref 0.3–1.2)
Total Protein: 7.3 g/dL (ref 6.5–8.1)

## 2021-10-16 LAB — VITAMIN B12: Vitamin B-12: 172 pg/mL — ABNORMAL LOW (ref 180–914)

## 2021-10-16 LAB — RETICULOCYTES
Immature Retic Fract: 12.4 % (ref 2.3–15.9)
RBC.: 4.87 MIL/uL (ref 3.87–5.11)
Retic Count, Absolute: 103.2 10*3/uL (ref 19.0–186.0)
Retic Ct Pct: 2.1 % (ref 0.4–3.1)

## 2021-10-16 MED ORDER — CYANOCOBALAMIN 1000 MCG/ML IJ SOLN
1000.0000 ug | Freq: Once | INTRAMUSCULAR | Status: AC
  Administered 2021-10-16: 1000 ug via INTRAMUSCULAR
  Filled 2021-10-16: qty 1

## 2021-10-16 NOTE — Patient Instructions (Signed)

## 2021-10-17 ENCOUNTER — Telehealth: Payer: Self-pay

## 2021-10-17 LAB — IRON AND TIBC
Iron: 71 ug/dL (ref 41–142)
Saturation Ratios: 18 % — ABNORMAL LOW (ref 21–57)
TIBC: 395 ug/dL (ref 236–444)
UIBC: 324 ug/dL (ref 120–384)

## 2021-10-17 LAB — FERRITIN: Ferritin: 68 ng/mL (ref 11–307)

## 2021-10-17 NOTE — Telephone Encounter (Signed)
-----   Message from [name], MD sent at 10/17/2021  9:14 AM EST ----- Call - the iron is low.  She needs IV iron.  Her B12 is also low.  We need to do the B12 injections every 2 weeks for now.  Make sure she has an appt to see Maralyn Sago in 8 weeks.  Cindee Lame

## 2021-10-17 NOTE — Telephone Encounter (Signed)
NANM - will try again later. 

## 2021-10-18 ENCOUNTER — Telehealth: Payer: Self-pay | Admitting: *Deleted

## 2021-10-18 ENCOUNTER — Telehealth: Payer: Self-pay | Admitting: Family

## 2021-10-18 NOTE — Telephone Encounter (Signed)
Per scheduling message Misty Stanley - called and lvm to get B-12 injection scheduled.

## 2021-10-18 NOTE — Telephone Encounter (Signed)
LMTCB

## 2021-10-30 ENCOUNTER — Other Ambulatory Visit: Payer: Self-pay | Admitting: Family

## 2021-10-31 ENCOUNTER — Telehealth: Payer: Self-pay | Admitting: Family

## 2021-11-03 ENCOUNTER — Other Ambulatory Visit (HOSPITAL_COMMUNITY): Payer: Self-pay

## 2021-11-03 ENCOUNTER — Inpatient Hospital Stay: Payer: Medicaid Other

## 2021-11-04 ENCOUNTER — Other Ambulatory Visit (HOSPITAL_COMMUNITY): Payer: Self-pay

## 2021-11-04 MED ORDER — FENOFIBRATE 54 MG PO TABS
ORAL_TABLET | ORAL | 3 refills | Status: DC
  Filled 2021-11-04: qty 180, 90d supply, fill #0
  Filled 2022-04-11 – 2022-08-08 (×2): qty 60, 30d supply, fill #1

## 2021-11-10 ENCOUNTER — Other Ambulatory Visit (HOSPITAL_COMMUNITY): Payer: Self-pay

## 2021-11-10 MED ORDER — SYNJARDY 5-500 MG PO TABS
ORAL_TABLET | ORAL | 6 refills | Status: DC
Start: 2021-11-10 — End: 2021-12-18
  Filled 2021-11-10: qty 60, 30d supply, fill #0

## 2021-11-14 ENCOUNTER — Other Ambulatory Visit: Payer: Self-pay | Admitting: Family

## 2021-11-14 DIAGNOSIS — D649 Anemia, unspecified: Secondary | ICD-10-CM

## 2021-11-14 DIAGNOSIS — D500 Iron deficiency anemia secondary to blood loss (chronic): Secondary | ICD-10-CM

## 2021-11-14 DIAGNOSIS — D510 Vitamin B12 deficiency anemia due to intrinsic factor deficiency: Secondary | ICD-10-CM

## 2021-11-15 ENCOUNTER — Inpatient Hospital Stay: Payer: Medicaid Other

## 2021-11-15 ENCOUNTER — Other Ambulatory Visit: Payer: Self-pay | Admitting: Family

## 2021-11-15 ENCOUNTER — Inpatient Hospital Stay: Payer: Medicaid Other | Attending: Hematology & Oncology

## 2021-11-15 ENCOUNTER — Other Ambulatory Visit: Payer: Self-pay

## 2021-11-15 VITALS — BP 133/65 | HR 88 | Resp 20

## 2021-11-15 VITALS — BP 142/63 | HR 90 | Temp 97.9°F | Resp 17

## 2021-11-15 DIAGNOSIS — D500 Iron deficiency anemia secondary to blood loss (chronic): Secondary | ICD-10-CM

## 2021-11-15 DIAGNOSIS — D509 Iron deficiency anemia, unspecified: Secondary | ICD-10-CM | POA: Insufficient documentation

## 2021-11-15 DIAGNOSIS — D510 Vitamin B12 deficiency anemia due to intrinsic factor deficiency: Secondary | ICD-10-CM | POA: Diagnosis not present

## 2021-11-15 DIAGNOSIS — K909 Intestinal malabsorption, unspecified: Secondary | ICD-10-CM

## 2021-11-15 LAB — CBC WITH DIFFERENTIAL (CANCER CENTER ONLY)
Abs Immature Granulocytes: 0.05 10*3/uL (ref 0.00–0.07)
Basophils Absolute: 0 10*3/uL (ref 0.0–0.1)
Basophils Relative: 1 %
Eosinophils Absolute: 0.4 10*3/uL (ref 0.0–0.5)
Eosinophils Relative: 6 %
HCT: 41.7 % (ref 36.0–46.0)
Hemoglobin: 14.1 g/dL (ref 12.0–15.0)
Immature Granulocytes: 1 %
Lymphocytes Relative: 38 %
Lymphs Abs: 2.5 10*3/uL (ref 0.7–4.0)
MCH: 29.8 pg (ref 26.0–34.0)
MCHC: 33.8 g/dL (ref 30.0–36.0)
MCV: 88.2 fL (ref 80.0–100.0)
Monocytes Absolute: 0.4 10*3/uL (ref 0.1–1.0)
Monocytes Relative: 6 %
Neutro Abs: 3.2 10*3/uL (ref 1.7–7.7)
Neutrophils Relative %: 48 %
Platelet Count: 285 10*3/uL (ref 150–400)
RBC: 4.73 MIL/uL (ref 3.87–5.11)
RDW: 13 % (ref 11.5–15.5)
WBC Count: 6.6 10*3/uL (ref 4.0–10.5)
nRBC: 0 % (ref 0.0–0.2)

## 2021-11-15 LAB — CMP (CANCER CENTER ONLY)
ALT: 26 U/L (ref 0–44)
AST: 24 U/L (ref 15–41)
Albumin: 4.4 g/dL (ref 3.5–5.0)
Alkaline Phosphatase: 64 U/L (ref 38–126)
Anion gap: 9 (ref 5–15)
BUN: 17 mg/dL (ref 6–20)
CO2: 26 mmol/L (ref 22–32)
Calcium: 10 mg/dL (ref 8.9–10.3)
Chloride: 97 mmol/L — ABNORMAL LOW (ref 98–111)
Creatinine: 0.68 mg/dL (ref 0.44–1.00)
GFR, Estimated: >60 mL/min (ref >60)
Glucose, Bld: 178 mg/dL — ABNORMAL HIGH (ref 70–99)
Potassium: 4 mmol/L (ref 3.5–5.1)
Sodium: 132 mmol/L — ABNORMAL LOW (ref 135–145)
Total Bilirubin: 0.5 mg/dL (ref 0.3–1.2)
Total Protein: 7.3 g/dL (ref 6.5–8.1)

## 2021-11-15 LAB — IRON AND TIBC
Iron: 89 ug/dL (ref 41–142)
Saturation Ratios: 22 % (ref 21–57)
TIBC: 406 ug/dL (ref 236–444)
UIBC: 317 ug/dL (ref 120–384)

## 2021-11-15 LAB — RETICULOCYTES
Immature Retic Fract: 17.1 % — ABNORMAL HIGH (ref 2.3–15.9)
RBC.: 4.7 MIL/uL (ref 3.87–5.11)
Retic Count, Absolute: 107.2 10*3/uL (ref 19.0–186.0)
Retic Ct Pct: 2.3 % (ref 0.4–3.1)

## 2021-11-15 LAB — FERRITIN: Ferritin: 40 ng/mL (ref 11–307)

## 2021-11-15 LAB — VITAMIN B12: Vitamin B-12: 260 pg/mL (ref 180–914)

## 2021-11-15 MED ORDER — CYANOCOBALAMIN 1000 MCG/ML IJ SOLN
1000.0000 ug | Freq: Once | INTRAMUSCULAR | Status: AC
  Administered 2021-11-15: 10:00:00 1000 ug via INTRAMUSCULAR
  Filled 2021-11-15: qty 1

## 2021-11-15 MED ORDER — SODIUM CHLORIDE 0.9 % IV SOLN
1000.0000 mg | Freq: Once | INTRAVENOUS | Status: AC
  Administered 2021-11-15: 10:00:00 1000 mg via INTRAVENOUS
  Filled 2021-11-15: qty 10

## 2021-11-15 MED ORDER — FAMOTIDINE 20 MG IN NS 100 ML IVPB
20.0000 mg | Freq: Once | INTRAVENOUS | Status: DC
  Filled 2021-11-15: qty 100

## 2021-11-15 NOTE — Progress Notes (Signed)
No pre meds needed prior to Monoferric per order of Dr. Myna Hidalgo.

## 2021-11-15 NOTE — Patient Instructions (Signed)

## 2021-11-15 NOTE — Patient Instructions (Signed)
Vitamin B12 Deficiency ?Vitamin B12 deficiency occurs when the body does not have enough vitamin B12, which is an important vitamin. The body needs this vitamin: ?To make red blood cells. ?To make DNA. This is the genetic material inside cells. ?To help the nerves work properly so they can carry messages from the brain to the body. ?Vitamin B12 deficiency can cause various health problems, such as a low red blood cell count (anemia) or nerve damage. ?What are the causes? ?This condition may be caused by: ?Not eating enough foods that contain vitamin B12. ?Not having enough stomach acid and digestive fluids to properly absorb vitamin B12 from the food that you eat. ?Certain digestive system diseases that make it hard to absorb vitamin B12. These diseases include Crohn's disease, chronic pancreatitis, and cystic fibrosis. ?A condition in which the body does not make enough of a protein (intrinsic factor), resulting in too few red blood cells (pernicious anemia). ?Having a surgery in which part of the stomach or small intestine is removed. ?Taking certain medicines that make it hard for the body to absorb vitamin B12. These medicines include: ?Heartburn medicines (antacids and proton pump inhibitors). ?Certain antibiotic medicines. ?Some medicines that are used to treat diabetes, tuberculosis, gout, or high cholesterol. ?What increases the risk? ?The following factors may make you more likely to develop a B12 deficiency: ?Being older than age 50. ?Eating a vegetarian or vegan diet, especially while you are pregnant. ?Eating a poor diet while you are pregnant. ?Taking certain medicines. ?Having alcoholism. ?What are the signs or symptoms? ?In some cases, there are no symptoms of this condition. If the condition leads to anemia or nerve damage, various symptoms can occur, such as: ?Weakness. ?Fatigue. ?Loss of appetite. ?Weight loss. ?Numbness or tingling in your hands and feet. ?Redness and burning of the  tongue. ?Confusion or memory problems. ?Depression. ?Sensory problems, such as color blindness, ringing in the ears, or loss of taste. ?Diarrhea or constipation. ?Trouble walking. ?If anemia is severe, symptoms can include: ?Shortness of breath. ?Dizziness. ?Rapid heart rate (tachycardia). ?How is this diagnosed? ?This condition may be diagnosed with a blood test to measure the level of vitamin B12 in your blood. You may also have other tests, including: ?A group of tests that measure certain characteristics of blood cells (complete blood count, CBC). ?A blood test to measure intrinsic factor. ?A procedure where a thin tube with a camera on the end is used to look into your stomach or intestines (endoscopy). ?Other tests may be needed to discover the cause of B12 deficiency. ?How is this treated? ?Treatment for this condition depends on the cause. This condition may be treated by: ?Changing your eating and drinking habits, such as: ?Eating more foods that contain vitamin B12. ?Drinking less alcohol or no alcohol. ?Getting vitamin B12 injections. ?Taking vitamin B12 supplements. Your health care provider will tell you which dosage is best for you. ?Follow these instructions at home: ?Eating and drinking ? ?Eat lots of healthy foods that contain vitamin B12, including: ?Meats and poultry. This includes beef, pork, chicken, turkey, and organ meats, such as liver. ?Seafood. This includes clams, rainbow trout, salmon, tuna, and haddock. ?Eggs. ?Cereal and dairy products that are fortified. This means that vitamin B12 has been added to the food. Check the label on the package to see if the food is fortified. ?The items listed above may not be a complete list of recommended foods and beverages. Contact a dietitian for more information. ?General instructions ?Get any   injections that are prescribed by your health care provider. ?Take supplements only as told by your health care provider. Follow the directions carefully. ?Do  not drink alcohol if your health care provider tells you not to. In some cases, you may only be asked to limit alcohol use. ?Keep all follow-up visits as told by your health care provider. This is important. ?Contact a health care provider if: ?Your symptoms come back. ?Get help right away if you: ?Develop shortness of breath. ?Have a rapid heart rate. ?Have chest pain. ?Become dizzy or lose consciousness. ?Summary ?Vitamin B12 deficiency occurs when the body does not have enough vitamin B12. ?The main causes of vitamin B12 deficiency include dietary deficiency, digestive diseases, pernicious anemia, and having a surgery in which part of the stomach or small intestine is removed. ?In some cases, there are no symptoms of this condition. If the condition leads to anemia or nerve damage, various symptoms can occur, such as weakness, shortness of breath, and numbness. ?Treatment may include getting vitamin B12 injections or taking vitamin B12 supplements. Eat lots of healthy foods that contain vitamin B12. ?This information is not intended to replace advice given to you by your health care provider. Make sure you discuss any questions you have with your health care provider. ?Document Revised: 05/17/2021 Document Reviewed: 07/29/2018 ?Elsevier Patient Education ? 2022 [employer] Inc[employer] ? ?

## 2021-11-17 ENCOUNTER — Telehealth: Payer: Self-pay | Admitting: *Deleted

## 2021-11-17 NOTE — Telephone Encounter (Signed)
Call received from patient's husband requesting a prescription for Ponstel for his wife and would like it today d/t they are leaving the country tomorrow morning.  Notified pt.'s husband that Dr. Myna Hidalgo is not in the office today and to contact pt.'s PCP.  Pt.'s husband states that they have attempted to contact pt.'s PCP four times today with no return call.  Encouraged pt to continue to try and contact pt.'s PCP d/t this office cannot prescribe Ponstel for pt at this time.

## 2021-12-15 ENCOUNTER — Other Ambulatory Visit: Payer: Self-pay | Admitting: Family

## 2021-12-15 DIAGNOSIS — D500 Iron deficiency anemia secondary to blood loss (chronic): Secondary | ICD-10-CM

## 2021-12-15 DIAGNOSIS — D510 Vitamin B12 deficiency anemia due to intrinsic factor deficiency: Secondary | ICD-10-CM

## 2021-12-18 ENCOUNTER — Telehealth: Payer: Self-pay | Admitting: *Deleted

## 2021-12-18 ENCOUNTER — Other Ambulatory Visit: Payer: Self-pay

## 2021-12-18 ENCOUNTER — Inpatient Hospital Stay: Payer: Medicaid Other | Admitting: Family

## 2021-12-18 ENCOUNTER — Inpatient Hospital Stay: Payer: Medicaid Other | Attending: Hematology & Oncology

## 2021-12-18 ENCOUNTER — Other Ambulatory Visit (HOSPITAL_COMMUNITY): Payer: Self-pay

## 2021-12-18 ENCOUNTER — Inpatient Hospital Stay: Payer: Medicaid Other

## 2021-12-18 ENCOUNTER — Encounter: Payer: Self-pay | Admitting: Family

## 2021-12-18 VITALS — BP 136/71 | HR 87 | Temp 98.3°F | Resp 17 | Ht 60.0 in | Wt 118.8 lb

## 2021-12-18 DIAGNOSIS — D500 Iron deficiency anemia secondary to blood loss (chronic): Secondary | ICD-10-CM

## 2021-12-18 DIAGNOSIS — D509 Iron deficiency anemia, unspecified: Secondary | ICD-10-CM | POA: Diagnosis present

## 2021-12-18 DIAGNOSIS — D510 Vitamin B12 deficiency anemia due to intrinsic factor deficiency: Secondary | ICD-10-CM

## 2021-12-18 DIAGNOSIS — K909 Intestinal malabsorption, unspecified: Secondary | ICD-10-CM

## 2021-12-18 DIAGNOSIS — N921 Excessive and frequent menstruation with irregular cycle: Secondary | ICD-10-CM | POA: Insufficient documentation

## 2021-12-18 LAB — CBC WITH DIFFERENTIAL (CANCER CENTER ONLY)
Abs Immature Granulocytes: 0.02 10*3/uL (ref 0.00–0.07)
Basophils Absolute: 0 10*3/uL (ref 0.0–0.1)
Basophils Relative: 0 %
Eosinophils Absolute: 0.2 10*3/uL (ref 0.0–0.5)
Eosinophils Relative: 4 %
HCT: 39.6 % (ref 36.0–46.0)
Hemoglobin: 13.6 g/dL (ref 12.0–15.0)
Immature Granulocytes: 0 %
Lymphocytes Relative: 43 %
Lymphs Abs: 2.5 10*3/uL (ref 0.7–4.0)
MCH: 29.8 pg (ref 26.0–34.0)
MCHC: 34.3 g/dL (ref 30.0–36.0)
MCV: 86.7 fL (ref 80.0–100.0)
Monocytes Absolute: 0.4 10*3/uL (ref 0.1–1.0)
Monocytes Relative: 6 %
Neutro Abs: 2.7 10*3/uL (ref 1.7–7.7)
Neutrophils Relative %: 47 %
Platelet Count: 225 10*3/uL (ref 150–400)
RBC: 4.57 MIL/uL (ref 3.87–5.11)
RDW: 12.6 % (ref 11.5–15.5)
WBC Count: 5.8 10*3/uL (ref 4.0–10.5)
nRBC: 0 % (ref 0.0–0.2)

## 2021-12-18 LAB — RETICULOCYTES
Immature Retic Fract: 6.8 % (ref 2.3–15.9)
RBC.: 4.62 MIL/uL (ref 3.87–5.11)
Retic Count, Absolute: 71.6 10*3/uL (ref 19.0–186.0)
Retic Ct Pct: 1.6 % (ref 0.4–3.1)

## 2021-12-18 LAB — IRON AND IRON BINDING CAPACITY (CC-WL,HP ONLY)
Iron: 172 ug/dL — ABNORMAL HIGH (ref 28–170)
Saturation Ratios: 51 % — ABNORMAL HIGH (ref 10.4–31.8)
TIBC: 337 ug/dL (ref 250–450)
UIBC: 165 ug/dL (ref 148–442)

## 2021-12-18 LAB — VITAMIN B12: Vitamin B-12: 331 pg/mL (ref 180–914)

## 2021-12-18 LAB — FERRITIN: Ferritin: 404 ng/mL — ABNORMAL HIGH (ref 11–307)

## 2021-12-18 MED ORDER — B-12 100-5000 MCG SL SUBL
2500.0000 ug | SUBLINGUAL_TABLET | Freq: Every day | SUBLINGUAL | 2 refills | Status: AC
  Filled 2021-12-18: qty 90, 90d supply, fill #0
  Filled 2021-12-18: qty 90, fill #0

## 2021-12-18 MED ORDER — CYANOCOBALAMIN 1000 MCG/ML IJ SOLN
1000.0000 ug | Freq: Once | INTRAMUSCULAR | Status: AC
  Administered 2021-12-18: 1000 ug via INTRAMUSCULAR
  Filled 2021-12-18: qty 1

## 2021-12-18 NOTE — Patient Instructions (Signed)
Vitamin B12 Deficiency ?Vitamin B12 deficiency occurs when the body does not have enough vitamin B12, which is an important vitamin. The body needs this vitamin: ?To make red blood cells. ?To make DNA. This is the genetic material inside cells. ?To help the nerves work properly so they can carry messages from the brain to the body. ?Vitamin B12 deficiency can cause various health problems, such as a low red blood cell count (anemia) or nerve damage. ?What are the causes? ?This condition may be caused by: ?Not eating enough foods that contain vitamin B12. ?Not having enough stomach acid and digestive fluids to properly absorb vitamin B12 from the food that you eat. ?Certain digestive system diseases that make it hard to absorb vitamin B12. These diseases include Crohn's disease, chronic pancreatitis, and cystic fibrosis. ?A condition in which the body does not make enough of a protein (intrinsic factor), resulting in too few red blood cells (pernicious anemia). ?Having a surgery in which part of the stomach or small intestine is removed. ?Taking certain medicines that make it hard for the body to absorb vitamin B12. These medicines include: ?Heartburn medicines (antacids and proton pump inhibitors). ?Certain antibiotic medicines. ?Some medicines that are used to treat diabetes, tuberculosis, gout, or high cholesterol. ?What increases the risk? ?The following factors may make you more likely to develop a B12 deficiency: ?Being older than age 50. ?Eating a vegetarian or vegan diet, especially while you are pregnant. ?Eating a poor diet while you are pregnant. ?Taking certain medicines. ?Having alcoholism. ?What are the signs or symptoms? ?In some cases, there are no symptoms of this condition. If the condition leads to anemia or nerve damage, various symptoms can occur, such as: ?Weakness. ?Fatigue. ?Loss of appetite. ?Weight loss. ?Numbness or tingling in your hands and feet. ?Redness and burning of the  tongue. ?Confusion or memory problems. ?Depression. ?Sensory problems, such as color blindness, ringing in the ears, or loss of taste. ?Diarrhea or constipation. ?Trouble walking. ?If anemia is severe, symptoms can include: ?Shortness of breath. ?Dizziness. ?Rapid heart rate (tachycardia). ?How is this diagnosed? ?This condition may be diagnosed with a blood test to measure the level of vitamin B12 in your blood. You may also have other tests, including: ?A group of tests that measure certain characteristics of blood cells (complete blood count, CBC). ?A blood test to measure intrinsic factor. ?A procedure where a thin tube with a camera on the end is used to look into your stomach or intestines (endoscopy). ?Other tests may be needed to discover the cause of B12 deficiency. ?How is this treated? ?Treatment for this condition depends on the cause. This condition may be treated by: ?Changing your eating and drinking habits, such as: ?Eating more foods that contain vitamin B12. ?Drinking less alcohol or no alcohol. ?Getting vitamin B12 injections. ?Taking vitamin B12 supplements. Your health care provider will tell you which dosage is best for you. ?Follow these instructions at home: ?Eating and drinking ? ?Eat lots of healthy foods that contain vitamin B12, including: ?Meats and poultry. This includes beef, pork, chicken, turkey, and organ meats, such as liver. ?Seafood. This includes clams, rainbow trout, salmon, tuna, and haddock. ?Eggs. ?Cereal and dairy products that are fortified. This means that vitamin B12 has been added to the food. Check the label on the package to see if the food is fortified. ?The items listed above may not be a complete list of recommended foods and beverages. Contact a dietitian for more information. ?General instructions ?Get any   injections that are prescribed by your health care provider. ?Take supplements only as told by your health care provider. Follow the directions carefully. ?Do  not drink alcohol if your health care provider tells you not to. In some cases, you may only be asked to limit alcohol use. ?Keep all follow-up visits as told by your health care provider. This is important. ?Contact a health care provider if: ?Your symptoms come back. ?Get help right away if you: ?Develop shortness of breath. ?Have a rapid heart rate. ?Have chest pain. ?Become dizzy or lose consciousness. ?Summary ?Vitamin B12 deficiency occurs when the body does not have enough vitamin B12. ?The main causes of vitamin B12 deficiency include dietary deficiency, digestive diseases, pernicious anemia, and having a surgery in which part of the stomach or small intestine is removed. ?In some cases, there are no symptoms of this condition. If the condition leads to anemia or nerve damage, various symptoms can occur, such as weakness, shortness of breath, and numbness. ?Treatment may include getting vitamin B12 injections or taking vitamin B12 supplements. Eat lots of healthy foods that contain vitamin B12. ?This information is not intended to replace advice given to you by your health care provider. Make sure you discuss any questions you have with your health care provider. ?Document Revised: 05/17/2021 Document Reviewed: 07/29/2018 ?Elsevier Patient Education ? 2022 [employer] Inc[employer] ? ?

## 2021-12-18 NOTE — Progress Notes (Signed)
Hematology and Oncology Follow Up Visit  Mary Logan [id number] [birth year] 51 y.o. 12/18/2021   Principle Diagnosis:  Iron deficiency anemia-menometrorrhagia Pernicious anemia   Current Therapy:        IV iron-Monoferric given on 06/07/2021 Vitamin B12 sublingual 2,500 mcg daily   Interim History:  Mary Logan is here today for follow-up and B 12 injection. She is doing well and has no complaints at this time.  Her cycle remains regular with heavy flow the first two days. No other blood loss noted.  No bruising or petechiae.  She is interested in changing from B 12 injections to oral B 12 as this is easier for her.  No fever, chills, n/v, cough, rash, dizziness, SOB, chest pain, palpitations, abdominal pain or changes in bowel or bladder habits.  No swelling, tenderness, numbness or tingling in her extremities at this time.  No falls or syncope.  She has maintained a good appetite and is staying well hydrated throughout the day. Her weight is stable at 118 lbs.   ECOG Performance Status: 1 - Symptomatic but completely ambulatory  Medications:  Allergies as of 12/18/2021   No Known Allergies      Medication List        Accurate as of December 18, 2021  8:42 AM. If you have any questions, ask your nurse or doctor.          ergocalciferol 1.25 MG (50000 UT) capsule Commonly known as: VITAMIN D2 Take by mouth.   fenofibrate 54 MG tablet TAKE 2 TABLETS BY MOUTH ONCE A DAY WITH FOOD   levothyroxine 137 MCG tablet Commonly known as: SYNTHROID TAKE 1 TABLET BY MOUTH ONCE DAILY What changed: Another medication with the same name was removed. Continue taking this medication, and follow the directions you see here. Changed by: Lottie Dawson, NP   losartan 25 MG tablet Commonly known as: COZAAR Take 1 tablet by mouth once a day What changed: Another medication with the same name was removed. Continue taking this medication, and follow the directions you see here. Changed  by: Lottie Dawson, NP   Lovaza 1 g capsule Generic drug: omega-3 acid ethyl esters Take 3 g by mouth 2 (two) times daily.   Synjardy 5-500 MG Tabs Generic drug: Empagliflozin-metFORMIN HCl TAKE 1 TABLET BY MOUTH ONCE A DAY WITH A MEAL. What changed: Another medication with the same name was removed. Continue taking this medication, and follow the directions you see here. Changed by: Lottie Dawson, NP   VITAMIN B12 PO Take 1 tablet by mouth daily.        Allergies: No Known Allergies  Past Medical History, Surgical history, Social history, and Family History were reviewed and updated.  Review of Systems: All other 10 point review of systems is negative.   Physical Exam:  height is 5' (1.524 m) and weight is 118 lb 12 oz (53.9 kg). Her oral temperature is 98.3 F (36.8 C). Her blood pressure is 136/71 and her pulse is 87. Her respiration is 17 and oxygen saturation is 100%.   Wt Readings from Last 3 Encounters:  12/18/21 118 lb 12 oz (53.9 kg)  09/15/21 117 lb (53.1 kg)  07/14/21 115 lb (52.2 kg)    Ocular: Sclerae unicteric, pupils equal, round and reactive to light Ear-nose-throat: Oropharynx clear, dentition fair Lymphatic: No cervical or supraclavicular adenopathy Lungs no rales or rhonchi, good excursion bilaterally Heart regular rate and rhythm, no murmur appreciated Abd soft, nontender, positive bowel sounds MSK no focal  spinal tenderness, no joint edema Neuro: non-focal, well-oriented, appropriate affect Breasts: Deferred   Lab Results  Component Value Date   WBC 5.8 12/18/2021   HGB 13.6 12/18/2021   HCT 39.6 12/18/2021   MCV 86.7 12/18/2021   PLT 225 12/18/2021   Lab Results  Component Value Date   FERRITIN 40 11/15/2021   IRON 89 11/15/2021   TIBC 406 11/15/2021   UIBC 317 11/15/2021   IRONPCTSAT 22 11/15/2021   Lab Results  Component Value Date   RETICCTPCT 1.6 12/18/2021   RBC 4.57 12/18/2021   RBC 4.62 12/18/2021   No results found for:  KPAFRELGTCHN, LAMBDASER, KAPLAMBRATIO No results found for: IGGSERUM, IGA, IGMSERUM No results found for: Kathrynn Ducking, MSPIKE, SPEI   Chemistry      Component Value Date/Time   NA 132 (L) 11/15/2021 0917   K 4.0 11/15/2021 0917   CL 97 (L) 11/15/2021 0917   CO2 26 11/15/2021 0917   BUN 17 11/15/2021 0917   CREATININE 0.68 11/15/2021 0917      Component Value Date/Time   CALCIUM 10.0 11/15/2021 0917   ALKPHOS 64 11/15/2021 0917   AST 24 11/15/2021 0917   ALT 26 11/15/2021 0917   BILITOT 0.5 11/15/2021 0917       Impression and Plan: Mary Logan is a very pleasant 51 yo [name] female with history of iron deficiency secondary to heavy cycles and 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  She received her B 12 injection today as planned and then will start sublingual B 12 tomorrow. Prescription sent to [facility] outpatient pharmacy per her request.  Iron studies are pending. We will replace if needed.  Follow-up in 3 months.   Lottie Dawson, NP 1/16/20238:42 AM

## 2021-12-18 NOTE — Telephone Encounter (Signed)
Per 10/08/22 los - gave upcoming appointments - confirmed 

## 2021-12-19 ENCOUNTER — Encounter: Payer: Self-pay | Admitting: Hematology & Oncology

## 2021-12-19 ENCOUNTER — Other Ambulatory Visit (HOSPITAL_COMMUNITY): Payer: Self-pay

## 2021-12-21 ENCOUNTER — Other Ambulatory Visit (HOSPITAL_COMMUNITY): Payer: Self-pay

## 2021-12-27 ENCOUNTER — Other Ambulatory Visit (HOSPITAL_COMMUNITY): Payer: Self-pay

## 2021-12-27 MED ORDER — SYNJARDY 5-500 MG PO TABS
ORAL_TABLET | ORAL | 6 refills | Status: DC
Start: 2021-12-27 — End: 2024-07-28
  Filled 2021-12-27 – 2021-12-28 (×2): qty 180, 90d supply, fill #0
  Filled 2022-04-11: qty 60, 30d supply, fill #1

## 2021-12-28 ENCOUNTER — Other Ambulatory Visit (HOSPITAL_COMMUNITY): Payer: Self-pay

## 2021-12-28 MED ORDER — SYNJARDY 5-500 MG PO TABS
1.0000 | ORAL_TABLET | Freq: Two times a day (BID) | ORAL | 6 refills | Status: DC
  Filled 2022-08-27 – 2022-08-30 (×2): qty 60, 30d supply, fill #0

## 2021-12-29 ENCOUNTER — Other Ambulatory Visit (HOSPITAL_COMMUNITY): Payer: Self-pay

## 2022-01-05 ENCOUNTER — Other Ambulatory Visit (HOSPITAL_COMMUNITY): Payer: Self-pay

## 2022-01-27 ENCOUNTER — Other Ambulatory Visit (HOSPITAL_COMMUNITY): Payer: Self-pay

## 2022-03-19 ENCOUNTER — Inpatient Hospital Stay (HOSPITAL_BASED_OUTPATIENT_CLINIC_OR_DEPARTMENT_OTHER): Payer: 59 | Admitting: Family

## 2022-03-19 ENCOUNTER — Telehealth: Payer: Self-pay | Admitting: *Deleted

## 2022-03-19 ENCOUNTER — Encounter: Payer: Self-pay | Admitting: Family

## 2022-03-19 ENCOUNTER — Encounter: Payer: Self-pay | Admitting: Hematology & Oncology

## 2022-03-19 ENCOUNTER — Inpatient Hospital Stay: Payer: 59 | Attending: Hematology & Oncology

## 2022-03-19 VITALS — BP 107/81 | HR 95 | Temp 98.2°F | Resp 17 | Wt 116.1 lb

## 2022-03-19 DIAGNOSIS — D510 Vitamin B12 deficiency anemia due to intrinsic factor deficiency: Secondary | ICD-10-CM | POA: Diagnosis not present

## 2022-03-19 DIAGNOSIS — N921 Excessive and frequent menstruation with irregular cycle: Secondary | ICD-10-CM | POA: Insufficient documentation

## 2022-03-19 DIAGNOSIS — D500 Iron deficiency anemia secondary to blood loss (chronic): Secondary | ICD-10-CM

## 2022-03-19 DIAGNOSIS — E538 Deficiency of other specified B group vitamins: Secondary | ICD-10-CM | POA: Insufficient documentation

## 2022-03-19 LAB — CBC WITH DIFFERENTIAL (CANCER CENTER ONLY)
Abs Immature Granulocytes: 0.02 10*3/uL (ref 0.00–0.07)
Basophils Absolute: 0.1 10*3/uL (ref 0.0–0.1)
Basophils Relative: 1 %
Eosinophils Absolute: 0.6 10*3/uL — ABNORMAL HIGH (ref 0.0–0.5)
Eosinophils Relative: 8 %
HCT: 40.1 % (ref 36.0–46.0)
Hemoglobin: 14.4 g/dL (ref 12.0–15.0)
Immature Granulocytes: 0 %
Lymphocytes Relative: 39 %
Lymphs Abs: 2.9 10*3/uL (ref 0.7–4.0)
MCH: 31 pg (ref 26.0–34.0)
MCHC: 35.9 g/dL (ref 30.0–36.0)
MCV: 86.4 fL (ref 80.0–100.0)
Monocytes Absolute: 0.4 10*3/uL (ref 0.1–1.0)
Monocytes Relative: 6 %
Neutro Abs: 3.4 10*3/uL (ref 1.7–7.7)
Neutrophils Relative %: 46 %
Platelet Count: 262 10*3/uL (ref 150–400)
RBC: 4.64 MIL/uL (ref 3.87–5.11)
RDW: 12.9 % (ref 11.5–15.5)
WBC Count: 7.3 10*3/uL (ref 4.0–10.5)
nRBC: 0 % (ref 0.0–0.2)

## 2022-03-19 LAB — VITAMIN B12: Vitamin B-12: 3262 pg/mL — ABNORMAL HIGH (ref 180–914)

## 2022-03-19 LAB — RETICULOCYTES
Immature Retic Fract: 13.4 % (ref 2.3–15.9)
RBC.: 4.67 MIL/uL (ref 3.87–5.11)
Retic Count, Absolute: 112.1 10*3/uL (ref 19.0–186.0)
Retic Ct Pct: 2.4 % (ref 0.4–3.1)

## 2022-03-19 NOTE — Telephone Encounter (Signed)
03/19/22 los - gave upcoming appointments - confirmed ?

## 2022-03-19 NOTE — Progress Notes (Signed)
?Hematology and Oncology Follow Up Visit ? ?Mary Logan ?[id number] ?[birth year] 51 y.o. ?03/19/2022 ? ? ?Principle Diagnosis:  ?Iron deficiency anemia - menometrorrhagia ?Pernicious anemia ?  ?Current Therapy:        ?IV iron as indicated  ?Vitamin B12 sublingual 2,500 mcg daily ?  ?Interim History:  [name] is here today for follow-up. She is doing well and has no complaints at this time.  ?She states that her cycle this month was heavy. No other blood loss noted.  ?No bruising or petechiae.  ?No fever, chills, n/v, cough, rash, dizziness, SOB, chest pain, palpitations, abdominal pain or changes in bowel or bladder habits.  ?No swelling, tenderness, numbness or tingling in her extremities.  ?No falls or syncope.  ?She has maintained a good appetite and is staying well hydrated. Her weight is 116 lbs.  ? ?ECOG Performance Status: 1 - Symptomatic but completely ambulatory ? ?Medications:  ?Allergies as of 03/19/2022   ?No Known Allergies ?  ? ?  ?Medication List  ?  ? ?  ? Accurate as of March 19, 2022  1:02 PM. If you have any questions, ask your nurse or doctor.  ?  ?  ? ?  ? ?B-12 [phone] MCG Subl ?Place 2,500 mcg under the tongue daily. ?  ?ergocalciferol 1.25 MG (50000 UT) capsule ?Commonly known as: VITAMIN D2 ?Take by mouth. ?  ?fenofibrate 54 MG tablet ?TAKE 2 TABLETS BY MOUTH ONCE A DAY WITH FOOD ?  ?levothyroxine 137 MCG tablet ?Commonly known as: SYNTHROID ?TAKE 1 TABLET BY MOUTH ONCE DAILY ?  ?losartan 25 MG tablet ?Commonly known as: COZAAR ?Take 1 tablet by mouth once a day ?  ?Lovaza 1 g capsule ?Generic drug: omega-3 acid ethyl esters ?Take 3 g by mouth 2 (two) times daily. ?  ?Synjardy 5-500 MG Tabs ?Generic drug: Empagliflozin-metFORMIN HCl ?TAKE 1 TABLET BY MOUTH ONCE A DAY WITH A MEAL. ?  ?Synjardy 5-500 MG Tabs ?Generic drug: Empagliflozin-metFORMIN HCl ?Take 1 tablet by mouth 2 times a day ?  ?Synjardy 5-500 MG Tabs ?Generic drug: Empagliflozin-metFORMIN HCl ?Take 1 tablet by mouth twice a  day ?  ?VITAMIN B12 PO ?Take 1 tablet by mouth daily. ?  ? ?  ? ? ?Allergies: No Known Allergies ? ?Past Medical History, Surgical history, Social history, and Family History were reviewed and updated. ? ?Review of Systems: ?All other 10 point review of systems is negative.  ? ?Physical Exam: ? vitals were not taken for this visit.  ? ?Wt Readings from Last 3 Encounters:  ?12/18/21 118 lb 12 oz (53.9 kg)  ?09/15/21 117 lb (53.1 kg)  ?07/14/21 115 lb (52.2 kg)  ? ? ?Ocular: Sclerae unicteric, pupils equal, round and reactive to light ?Ear-nose-throat: Oropharynx clear, dentition fair ?Lymphatic: No cervical or supraclavicular adenopathy ?Lungs no rales or rhonchi, good excursion bilaterally ?Heart regular rate and rhythm, no murmur appreciated ?Abd soft, nontender, positive bowel sounds ?MSK no focal spinal tenderness, no joint edema ?Neuro: non-focal, well-oriented, appropriate affect ?Breasts: Deferred  ? ?Lab Results  ?Component Value Date  ? WBC 5.8 12/18/2021  ? HGB 13.6 12/18/2021  ? HCT 39.6 12/18/2021  ? MCV 86.7 12/18/2021  ? PLT 225 12/18/2021  ? ?Lab Results  ?Component Value Date  ? FERRITIN 404 (H) 12/18/2021  ? IRON 172 (H) 12/18/2021  ? TIBC 337 12/18/2021  ? UIBC 165 12/18/2021  ? IRONPCTSAT 51 (H) 12/18/2021  ? ?Lab Results  ?Component Value Date  ? RETICCTPCT 1.6 12/18/2021  ?  RBC 4.57 12/18/2021  ? RBC 4.62 12/18/2021  ? ?No results found for: KPAFRELGTCHN, LAMBDASER, KAPLAMBRATIO ?No results found for: IGGSERUM, IGA, IGMSERUM ?No results found for: TOTALPROTELP, ALBUMINELP, A1GS, A2GS, BETS, BETA2SER, GAMS, MSPIKE, SPEI ?  Chemistry   ?   ?Component Value Date/Time  ? NA 132 (L) 11/15/2021 0917  ? K 4.0 11/15/2021 0917  ? CL 97 (L) 11/15/2021 0917  ? CO2 26 11/15/2021 0917  ? BUN 17 11/15/2021 0917  ? CREATININE 0.68 11/15/2021 0917  ?    ?Component Value Date/Time  ? CALCIUM 10.0 11/15/2021 0917  ? ALKPHOS 64 11/15/2021 0917  ? AST 24 11/15/2021 0917  ? ALT 26 11/15/2021 0917  ? BILITOT 0.5  11/15/2021 0917  ?  ? ? ? ?Impression and Plan: [name] is a very pleasant 51 yo [place] female with history of iron deficiency secondary to heavy cycles and 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  ?Iron studies are pending.  ?Follow-up in 4 months.  ? ?Eileen Stanford, NP ?4/17/20231:02 PM ? ?

## 2022-03-20 ENCOUNTER — Other Ambulatory Visit: Payer: Self-pay | Admitting: Pharmacist

## 2022-03-20 ENCOUNTER — Telehealth: Payer: Self-pay | Admitting: *Deleted

## 2022-03-20 LAB — IRON AND IRON BINDING CAPACITY (CC-WL,HP ONLY)
Iron: 138 ug/dL (ref 28–170)
Saturation Ratios: 40 % — ABNORMAL HIGH (ref 10.4–31.8)
TIBC: 343 ug/dL (ref 250–450)
UIBC: 205 ug/dL (ref 148–442)

## 2022-03-20 LAB — FERRITIN: Ferritin: 240 ng/mL (ref 11–307)

## 2022-03-20 NOTE — Telephone Encounter (Signed)
-----   Message from Erenest Blank, NP sent at 03/20/2022  8:44 AM EDT ----- ?Ok to stop her B 12 supplement. B 12 is quite high > 3,000. Thank you! ? ? ?----- Message ----- ?From: Interface, Lab In [place] ?Sent: 03/19/2022   1:07 PM EDT ?To: Erenest Blank, NP ? ? ?

## 2022-03-20 NOTE — Telephone Encounter (Signed)
As noted below by Gillian Shields, I told the patient to stop taking the B 12 supplement because the B 12 level is quite high. She verbalized understanding. ?

## 2022-04-11 ENCOUNTER — Other Ambulatory Visit (HOSPITAL_COMMUNITY): Payer: Self-pay

## 2022-04-11 ENCOUNTER — Encounter: Payer: Self-pay | Admitting: Hematology & Oncology

## 2022-04-13 ENCOUNTER — Encounter: Payer: Self-pay | Admitting: Hematology & Oncology

## 2022-04-13 ENCOUNTER — Other Ambulatory Visit (HOSPITAL_COMMUNITY): Payer: Self-pay

## 2022-04-13 MED ORDER — METFORMIN HCL ER 500 MG PO TB24
ORAL_TABLET | ORAL | 3 refills | Status: DC
  Filled 2022-04-13: qty 60, 30d supply, fill #0

## 2022-04-13 MED ORDER — JARDIANCE 10 MG PO TABS
ORAL_TABLET | ORAL | 6 refills | Status: DC
  Filled 2022-04-13: qty 30, 30d supply, fill #0

## 2022-04-23 ENCOUNTER — Encounter: Payer: Self-pay | Admitting: Hematology & Oncology

## 2022-04-23 ENCOUNTER — Other Ambulatory Visit (HOSPITAL_COMMUNITY): Payer: Self-pay

## 2022-04-23 MED ORDER — JARDIANCE 10 MG PO TABS
ORAL_TABLET | ORAL | 6 refills | Status: DC
  Filled 2023-04-08: qty 30, 30d supply, fill #0

## 2022-04-23 MED ORDER — METFORMIN HCL ER 500 MG PO TB24: ORAL_TABLET | ORAL | 3 refills | Status: DC

## 2022-05-29 ENCOUNTER — Other Ambulatory Visit (HOSPITAL_COMMUNITY): Payer: Self-pay

## 2022-05-30 ENCOUNTER — Other Ambulatory Visit (HOSPITAL_COMMUNITY): Payer: Self-pay

## 2022-07-09 ENCOUNTER — Other Ambulatory Visit (HOSPITAL_COMMUNITY): Payer: Self-pay

## 2022-07-10 ENCOUNTER — Other Ambulatory Visit (HOSPITAL_COMMUNITY): Payer: Self-pay

## 2022-07-11 ENCOUNTER — Other Ambulatory Visit (HOSPITAL_COMMUNITY): Payer: Self-pay

## 2022-07-23 ENCOUNTER — Encounter: Payer: Self-pay | Admitting: Family

## 2022-07-23 ENCOUNTER — Inpatient Hospital Stay (HOSPITAL_BASED_OUTPATIENT_CLINIC_OR_DEPARTMENT_OTHER): Payer: 59 | Admitting: Family

## 2022-07-23 ENCOUNTER — Other Ambulatory Visit: Payer: Self-pay

## 2022-07-23 ENCOUNTER — Inpatient Hospital Stay: Payer: 59 | Attending: Hematology & Oncology

## 2022-07-23 VITALS — BP 119/74 | HR 73 | Temp 97.8°F | Resp 18 | Ht 60.0 in | Wt 114.0 lb

## 2022-07-23 DIAGNOSIS — N920 Excessive and frequent menstruation with regular cycle: Secondary | ICD-10-CM | POA: Diagnosis not present

## 2022-07-23 DIAGNOSIS — E538 Deficiency of other specified B group vitamins: Secondary | ICD-10-CM | POA: Diagnosis not present

## 2022-07-23 DIAGNOSIS — D510 Vitamin B12 deficiency anemia due to intrinsic factor deficiency: Secondary | ICD-10-CM

## 2022-07-23 DIAGNOSIS — D500 Iron deficiency anemia secondary to blood loss (chronic): Secondary | ICD-10-CM

## 2022-07-23 LAB — CBC WITH DIFFERENTIAL (CANCER CENTER ONLY)
Abs Immature Granulocytes: 0.03 10*3/uL (ref 0.00–0.07)
Basophils Absolute: 0 10*3/uL (ref 0.0–0.1)
Basophils Relative: 0 %
Eosinophils Absolute: 0.2 10*3/uL (ref 0.0–0.5)
Eosinophils Relative: 2 %
HCT: 42.3 % (ref 36.0–46.0)
Hemoglobin: 14.3 g/dL (ref 12.0–15.0)
Immature Granulocytes: 0 %
Lymphocytes Relative: 23 %
Lymphs Abs: 2.1 10*3/uL (ref 0.7–4.0)
MCH: 29.2 pg (ref 26.0–34.0)
MCHC: 33.8 g/dL (ref 30.0–36.0)
MCV: 86.3 fL (ref 80.0–100.0)
Monocytes Absolute: 0.3 10*3/uL (ref 0.1–1.0)
Monocytes Relative: 4 %
Neutro Abs: 6.3 10*3/uL (ref 1.7–7.7)
Neutrophils Relative %: 71 %
Platelet Count: 262 10*3/uL (ref 150–400)
RBC: 4.9 MIL/uL (ref 3.87–5.11)
RDW: 13.3 % (ref 11.5–15.5)
WBC Count: 9 10*3/uL (ref 4.0–10.5)
nRBC: 0 % (ref 0.0–0.2)

## 2022-07-23 LAB — IRON AND IRON BINDING CAPACITY (CC-WL,HP ONLY)
Iron: 105 ug/dL (ref 28–170)
Saturation Ratios: 29 % (ref 10.4–31.8)
TIBC: 360 ug/dL (ref 250–450)
UIBC: 255 ug/dL (ref 148–442)

## 2022-07-23 LAB — FERRITIN: Ferritin: 115 ng/mL (ref 11–307)

## 2022-07-23 LAB — RETICULOCYTES
Immature Retic Fract: 13.5 % (ref 2.3–15.9)
RBC.: 4.85 MIL/uL (ref 3.87–5.11)
Retic Count, Absolute: 91.2 10*3/uL (ref 19.0–186.0)
Retic Ct Pct: 1.9 % (ref 0.4–3.1)

## 2022-07-23 LAB — VITAMIN B12: Vitamin B-12: 2998 pg/mL — ABNORMAL HIGH (ref 180–914)

## 2022-07-23 NOTE — Progress Notes (Signed)
Hematology and Oncology Follow Up Visit  Mary Logan [id number] 02-24-1971 51 y.o. 07/23/2022   Principle Diagnosis:  Iron deficiency anemia - menometrorrhagia Pernicious anemia   Current Therapy:        IV iron as indicated  Vitamin B12 sublingual 2,500 mcg daily   Interim History:  Mary Logan is here today for follow-up. She is doing well but does note some fatigue.  She had a stomach virus last week after returning home from a visit with family in [place]. She states that her Covid test was negative.  She has not noted any blood loss. No bruising or petechiae.  No fever, chills, n/v, cough, rash, dizziness, SOB, chest pain, palpitations, abdominal pain or changes in bowel or bladder habits at this time.  No swelling, tenderness, numbness or tingling in her extremities.  No falls or syncope.  Appetite and hydration are much improved. Her weight is 114 lbs.   ECOG Performance Status: 1 - Symptomatic but completely ambulatory  Medications:  Allergies as of 07/23/2022   No Known Allergies      Medication List        Accurate as of July 23, 2022  1:02 PM. If you have any questions, ask your nurse or doctor.          B-12 [phone] MCG Subl Place 2,500 mcg under the tongue daily.   ergocalciferol 1.25 MG (50000 UT) capsule Commonly known as: VITAMIN D2 Take by mouth.   fenofibrate 54 MG tablet TAKE 2 TABLETS BY MOUTH ONCE A DAY WITH FOOD   Jardiance 10 MG Tabs tablet Generic drug: empagliflozin Take 1 tablet by mouth once a day   Jardiance 10 MG Tabs tablet Generic drug: empagliflozin Take 1 tablet by mouth once a day   levothyroxine 137 MCG tablet Commonly known as: SYNTHROID TAKE 1 TABLET BY MOUTH ONCE DAILY   losartan 25 MG tablet Commonly known as: COZAAR Take 1 tablet by mouth once a day   metFORMIN 500 MG 24 hr tablet Commonly known as: GLUCOPHAGE-XR Take 1 tablet by mouth twice a day   metFORMIN 500 MG 24 hr tablet Commonly known as:  GLUCOPHAGE-XR Take 1 tablet by mouth twice a day   omega-3 acid ethyl esters 1 g capsule Commonly known as: LOVAZA Take 3 g by mouth 2 (two) times daily.   Synjardy 5-500 MG Tabs Generic drug: Empagliflozin-metFORMIN HCl TAKE 1 TABLET BY MOUTH ONCE A DAY WITH A MEAL.   Synjardy 5-500 MG Tabs Generic drug: Empagliflozin-metFORMIN HCl Take 1 tablet by mouth 2 times a day   Synjardy 5-500 MG Tabs Generic drug: Empagliflozin-metFORMIN HCl Take 1 tablet by mouth twice a day   VITAMIN B12 PO Take 1 tablet by mouth daily.        Allergies: No Known Allergies  Past Medical History, Surgical history, Social history, and Family History were reviewed and updated.  Review of Systems: All other 10 point review of systems is negative.   Physical Exam:  vitals were not taken for this visit.   Wt Readings from Last 3 Encounters:  03/19/22 116 lb 1.9 oz (52.7 kg)  12/18/21 118 lb 12 oz (53.9 kg)  09/15/21 117 lb (53.1 kg)    Ocular: Sclerae unicteric, pupils equal, round and reactive to light Ear-nose-throat: Oropharynx clear, dentition fair Lymphatic: No cervical or supraclavicular adenopathy Lungs no rales or rhonchi, good excursion bilaterally Heart regular rate and rhythm, no murmur appreciated Abd soft, nontender, positive bowel sounds MSK no focal spinal  tenderness, no joint edema Neuro: non-focal, well-oriented, appropriate affect Breasts: Deferred   Lab Results  Component Value Date   WBC 7.3 03/19/2022   HGB 14.4 03/19/2022   HCT 40.1 03/19/2022   MCV 86.4 03/19/2022   PLT 262 03/19/2022   Lab Results  Component Value Date   FERRITIN 240 03/19/2022   IRON 138 03/19/2022   TIBC 343 03/19/2022   UIBC 205 03/19/2022   IRONPCTSAT 40 (H) 03/19/2022   Lab Results  Component Value Date   RETICCTPCT 2.4 03/19/2022   RBC 4.67 03/19/2022   RBC 4.64 03/19/2022   No results found for: "KPAFRELGTCHN", "LAMBDASER", "KAPLAMBRATIO" No results found for: "IGGSERUM",  "IGA", "IGMSERUM" No results found for: "TOTALPROTELP", "ALBUMINELP", "A1GS", "A2GS", "BETS", "BETA2SER", "GAMS", "MSPIKE", "SPEI"   Chemistry      Component Value Date/Time   NA 132 (L) 11/15/2021 0917   K 4.0 11/15/2021 0917   CL 97 (L) 11/15/2021 0917   CO2 26 11/15/2021 0917   BUN 17 11/15/2021 0917   CREATININE 0.68 11/15/2021 0917      Component Value Date/Time   CALCIUM 10.0 11/15/2021 0917   ALKPHOS 64 11/15/2021 0917   AST 24 11/15/2021 0917   ALT 26 11/15/2021 0917   BILITOT 0.5 11/15/2021 0917        Impression and Plan: Mary Logan is a very pleasant 51 yo [place] female with history of iron deficiency secondary to heavy cycles and 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  Iron studies are pending.  Follow-up in 6 months.   Eileen Stanford, NP 8/21/20231:02 PM

## 2022-07-24 ENCOUNTER — Telehealth: Payer: Self-pay

## 2022-07-24 NOTE — Telephone Encounter (Signed)
-----   Message from Erenest Blank, NP sent at 07/24/2022  1:27 PM EDT ----- Counts look great! B 12 is high so she can stop that supplement. Thank you!   ----- Message ----- From: Interface, Lab In [place] Sent: 07/23/2022   1:03 PM EDT To: Erenest Blank, NP

## 2022-07-25 ENCOUNTER — Telehealth: Payer: Self-pay | Admitting: *Deleted

## 2022-07-25 NOTE — Telephone Encounter (Signed)
Per 07/23/22 los - called and gave upcoming appointment- confirmed

## 2022-07-31 IMAGING — MG DIGITAL SCREENING BILAT W/ CAD
4 series · 4 of 4 positions shown · non-contrast
Comparison: None.

CLINICAL DATA: Screening. Baseline.

EXAM:
DIGITAL SCREENING BILATERAL MAMMOGRAM WITH CAD
TECHNIQUE: Bilateral screening digital craniocaudal and mediolateral oblique
mammograms were obtained. The images were evaluated with
computer-aided detection.

[L CC]
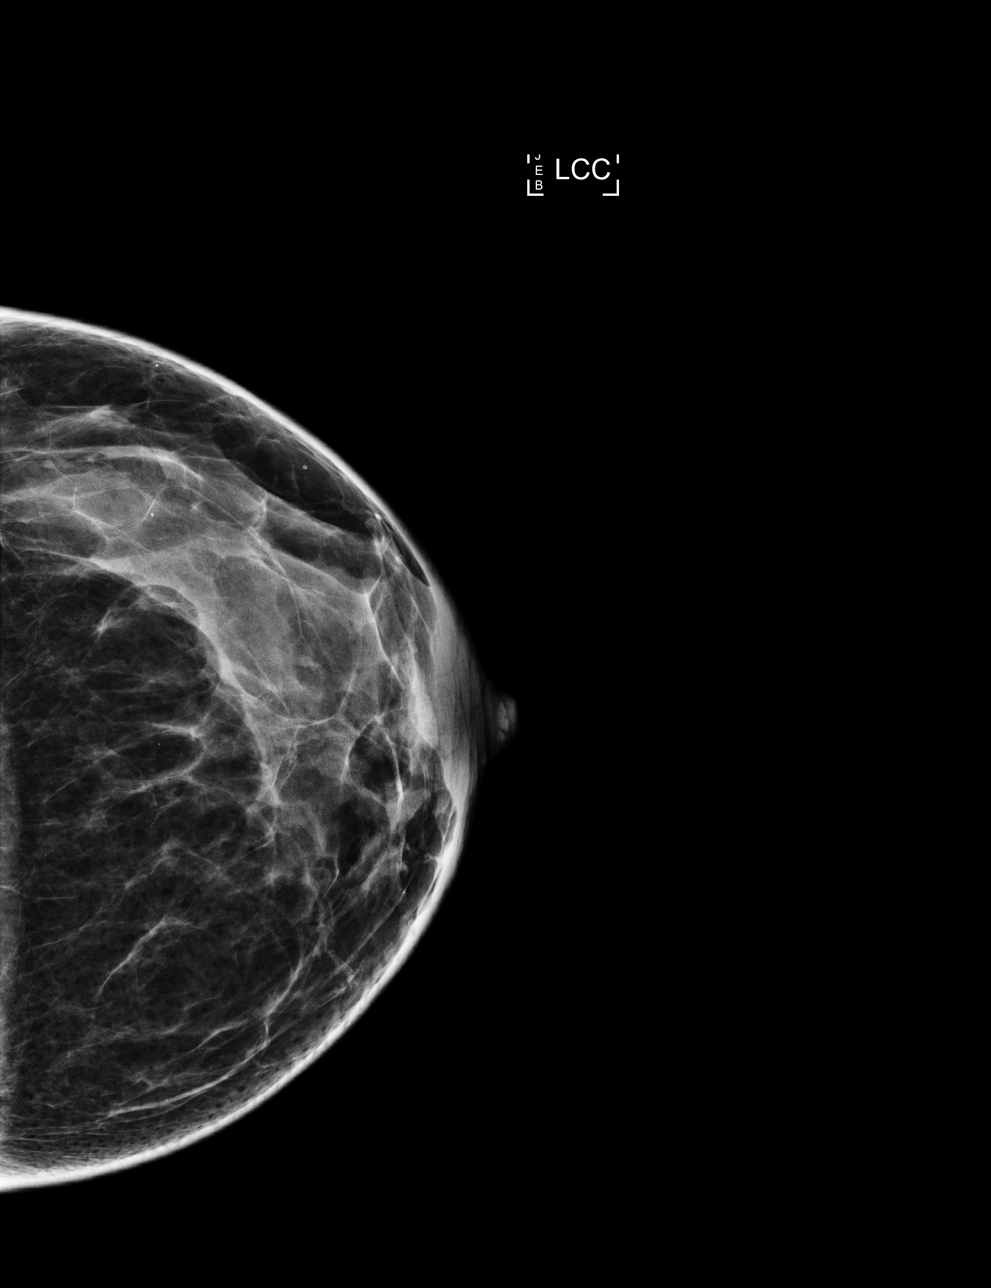

[R CC]
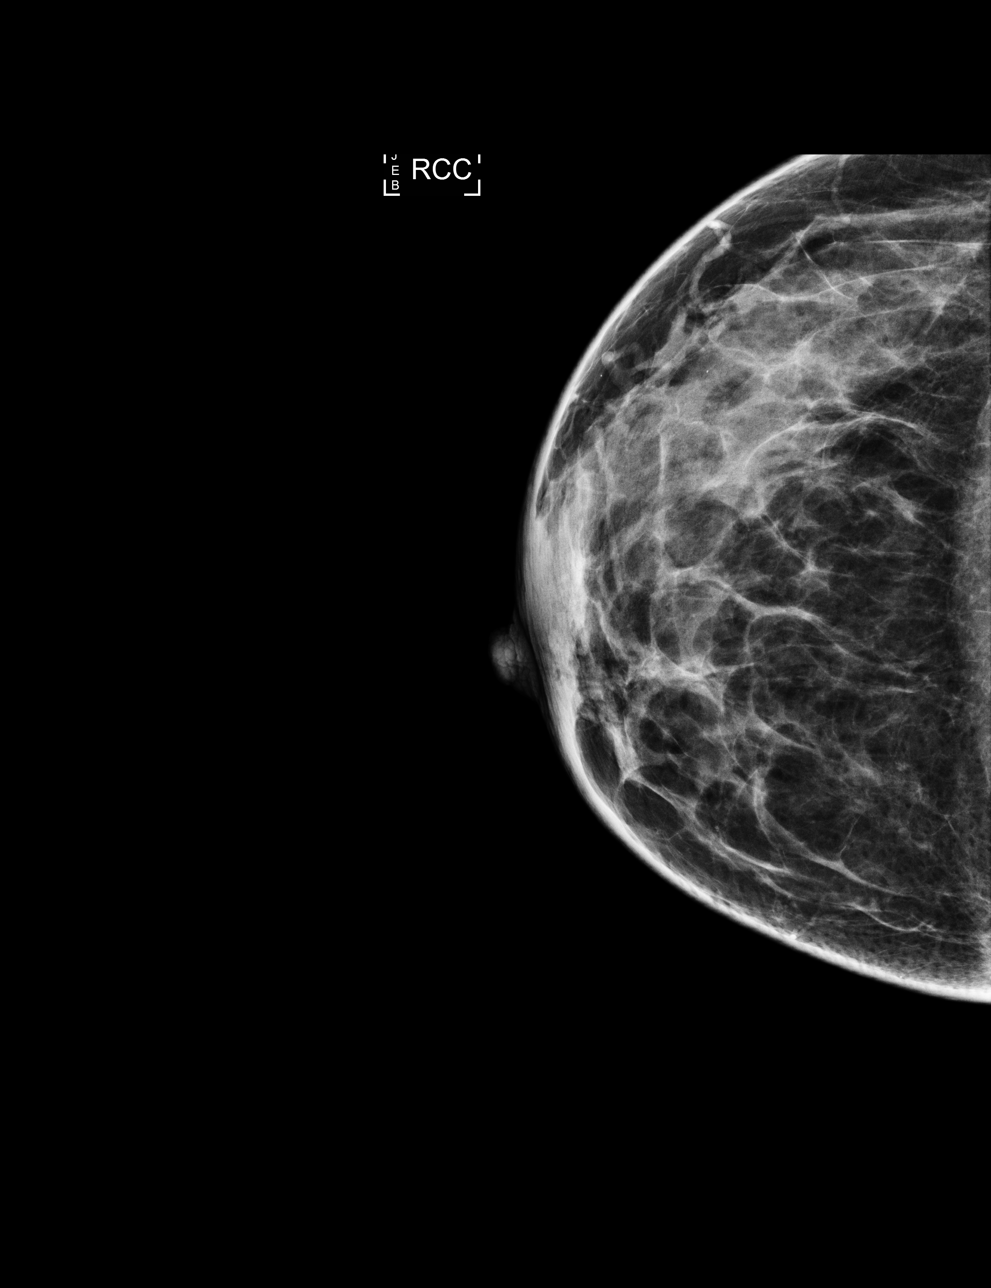

[L MLO]
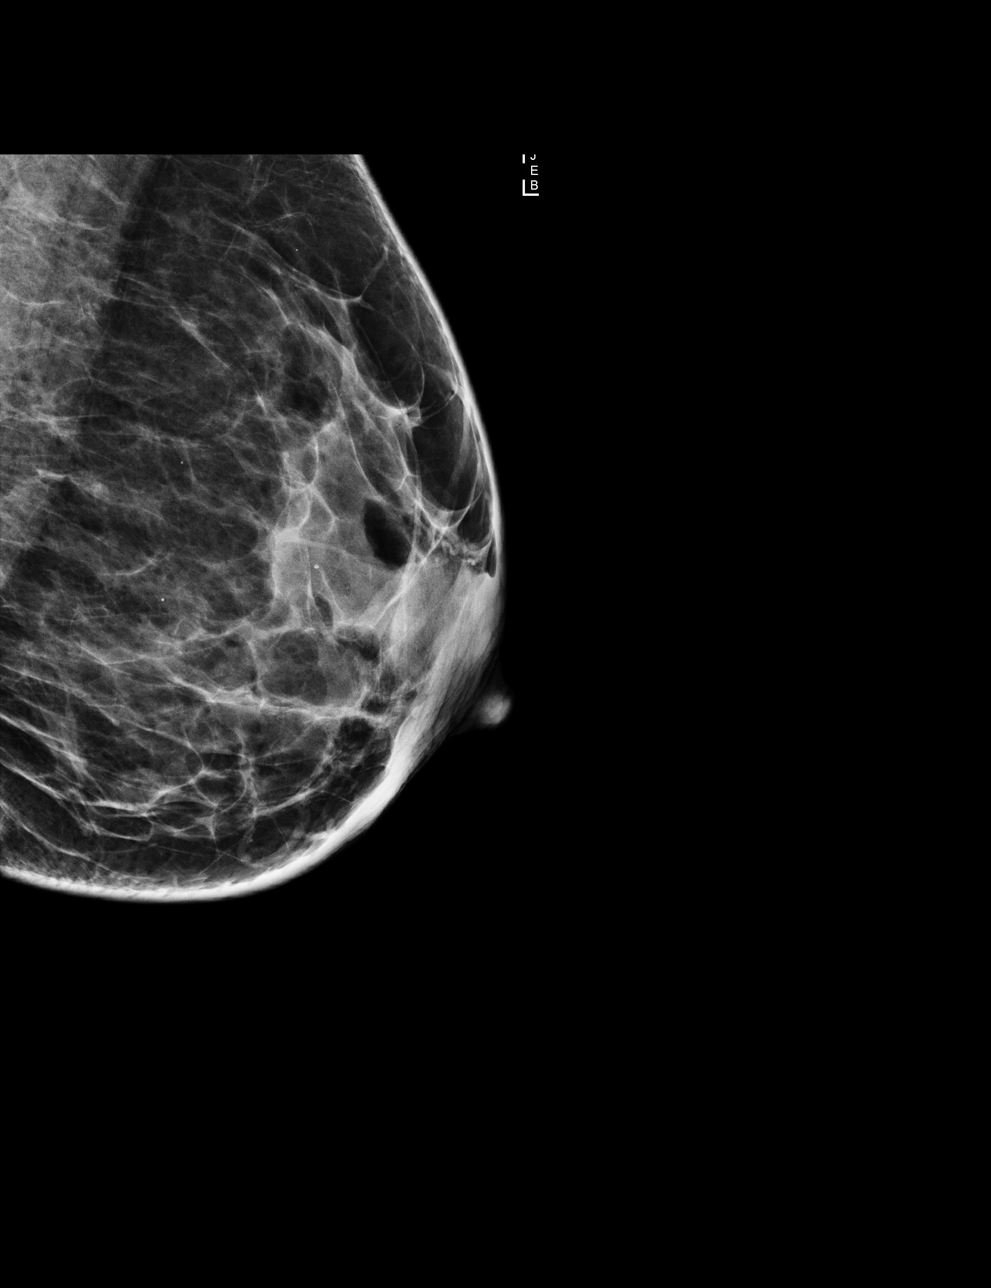

[R MLO]
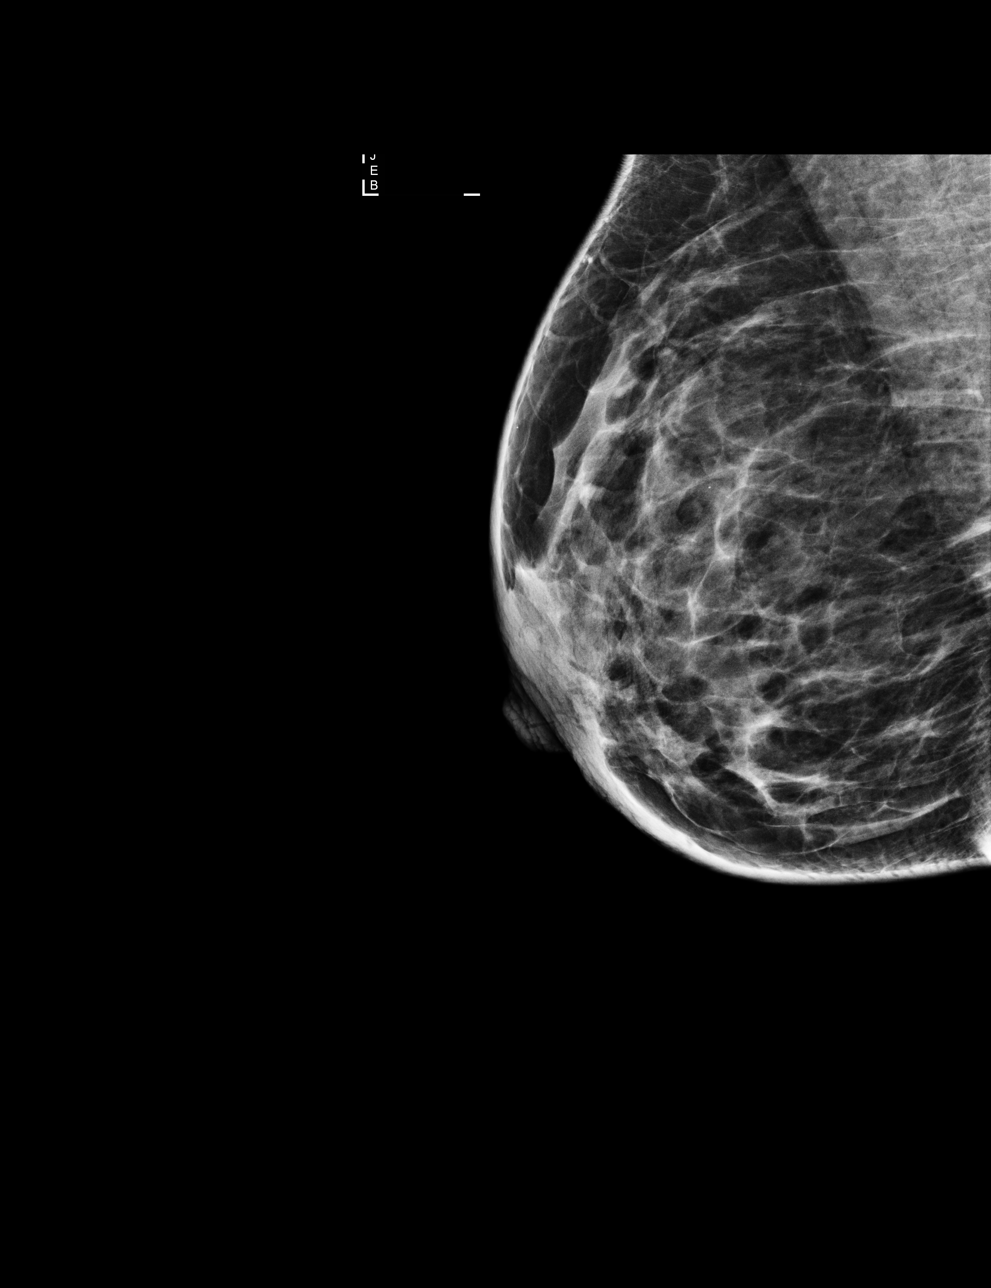

[4 of 4 positions shown; findings below may reference images not displayed]

ACR Breast Density Category c: The breast tissue is heterogeneously
dense, which may obscure small masses
FINDINGS: There are no findings suspicious for malignancy.
IMPRESSION: No mammographic evidence of malignancy. A result letter of this
screening mammogram will be mailed directly to the patient.

RECOMMENDATION:
Screening mammogram in one year. (Code:[id number])

BI-RADS CATEGORY  1: Negative.

## 2022-08-07 ENCOUNTER — Other Ambulatory Visit (HOSPITAL_COMMUNITY): Payer: Self-pay

## 2022-08-07 ENCOUNTER — Encounter: Payer: Self-pay | Admitting: Hematology & Oncology

## 2022-08-07 MED ORDER — LEVOTHYROXINE SODIUM 137 MCG PO TABS
ORAL_TABLET | ORAL | 5 refills | Status: DC
  Filled 2022-08-07: qty 30, 30d supply, fill #0
  Filled 2023-05-18: qty 30, 30d supply, fill #1
  Filled 2023-06-12: qty 30, 30d supply, fill #2
  Filled 2023-07-12: qty 30, 30d supply, fill #3

## 2022-08-08 ENCOUNTER — Other Ambulatory Visit (HOSPITAL_COMMUNITY): Payer: Self-pay

## 2022-08-08 MED ORDER — LEVOTHYROXINE SODIUM 137 MCG PO TABS: 137.0000 ug | ORAL_TABLET | Freq: Every day | ORAL | 2 refills | Status: DC

## 2022-08-08 MED ORDER — LEVOTHYROXINE SODIUM 137 MCG PO TABS
137.0000 ug | ORAL_TABLET | Freq: Every day | ORAL | 2 refills | Status: DC
  Filled 2022-09-18: qty 90, 90d supply, fill #0
  Filled 2022-11-12: qty 30, 30d supply, fill #1
  Filled 2022-12-29: qty 30, 30d supply, fill #2
  Filled 2023-02-18: qty 30, 30d supply, fill #3
  Filled 2023-04-08: qty 30, 30d supply, fill #4

## 2022-08-09 ENCOUNTER — Other Ambulatory Visit (HOSPITAL_COMMUNITY): Payer: Self-pay

## 2022-08-27 ENCOUNTER — Encounter: Payer: Self-pay | Admitting: Hematology & Oncology

## 2022-08-27 ENCOUNTER — Other Ambulatory Visit (HOSPITAL_COMMUNITY): Payer: Self-pay

## 2022-08-28 ENCOUNTER — Other Ambulatory Visit (HOSPITAL_COMMUNITY): Payer: Self-pay

## 2022-08-29 ENCOUNTER — Other Ambulatory Visit (HOSPITAL_COMMUNITY): Payer: Self-pay

## 2022-08-30 ENCOUNTER — Other Ambulatory Visit (HOSPITAL_COMMUNITY): Payer: Self-pay

## 2022-09-14 DIAGNOSIS — E1129 Type 2 diabetes mellitus with other diabetic kidney complication: Secondary | ICD-10-CM | POA: Diagnosis not present

## 2022-09-14 DIAGNOSIS — Z23 Encounter for immunization: Secondary | ICD-10-CM | POA: Diagnosis not present

## 2022-09-14 DIAGNOSIS — E039 Hypothyroidism, unspecified: Secondary | ICD-10-CM | POA: Diagnosis not present

## 2022-09-14 DIAGNOSIS — E782 Mixed hyperlipidemia: Secondary | ICD-10-CM | POA: Diagnosis not present

## 2022-09-14 DIAGNOSIS — Z862 Personal history of diseases of the blood and blood-forming organs and certain disorders involving the immune mechanism: Secondary | ICD-10-CM | POA: Diagnosis not present

## 2022-09-14 DIAGNOSIS — N181 Chronic kidney disease, stage 1: Secondary | ICD-10-CM | POA: Diagnosis not present

## 2022-09-14 DIAGNOSIS — R809 Proteinuria, unspecified: Secondary | ICD-10-CM | POA: Diagnosis not present

## 2022-09-14 DIAGNOSIS — Z0000 Encounter for general adult medical examination without abnormal findings: Secondary | ICD-10-CM | POA: Diagnosis not present

## 2022-09-18 ENCOUNTER — Encounter: Payer: Self-pay | Admitting: Hematology & Oncology

## 2022-09-18 ENCOUNTER — Other Ambulatory Visit (HOSPITAL_COMMUNITY): Payer: Self-pay

## 2022-09-22 ENCOUNTER — Other Ambulatory Visit (HOSPITAL_COMMUNITY): Payer: Self-pay

## 2022-09-25 DIAGNOSIS — E611 Iron deficiency: Secondary | ICD-10-CM | POA: Diagnosis not present

## 2022-09-25 DIAGNOSIS — R809 Proteinuria, unspecified: Secondary | ICD-10-CM | POA: Diagnosis not present

## 2022-09-25 DIAGNOSIS — E559 Vitamin D deficiency, unspecified: Secondary | ICD-10-CM | POA: Diagnosis not present

## 2022-09-25 DIAGNOSIS — E781 Pure hyperglyceridemia: Secondary | ICD-10-CM | POA: Diagnosis not present

## 2022-09-25 DIAGNOSIS — E1165 Type 2 diabetes mellitus with hyperglycemia: Secondary | ICD-10-CM | POA: Diagnosis not present

## 2022-09-25 DIAGNOSIS — E282 Polycystic ovarian syndrome: Secondary | ICD-10-CM | POA: Diagnosis not present

## 2022-09-25 DIAGNOSIS — E039 Hypothyroidism, unspecified: Secondary | ICD-10-CM | POA: Diagnosis not present

## 2022-09-25 DIAGNOSIS — E538 Deficiency of other specified B group vitamins: Secondary | ICD-10-CM | POA: Diagnosis not present

## 2022-09-26 DIAGNOSIS — Z1211 Encounter for screening for malignant neoplasm of colon: Secondary | ICD-10-CM | POA: Diagnosis not present

## 2022-09-26 DIAGNOSIS — Z1212 Encounter for screening for malignant neoplasm of rectum: Secondary | ICD-10-CM | POA: Diagnosis not present

## 2022-10-04 LAB — COLOGUARD: COLOGUARD: NEGATIVE

## 2022-10-04 LAB — EXTERNAL GENERIC LAB PROCEDURE: COLOGUARD: NEGATIVE

## 2022-10-09 ENCOUNTER — Other Ambulatory Visit (HOSPITAL_COMMUNITY): Payer: Self-pay

## 2022-10-10 ENCOUNTER — Other Ambulatory Visit (HOSPITAL_COMMUNITY): Payer: Self-pay

## 2022-10-10 MED ORDER — LOSARTAN POTASSIUM 25 MG PO TABS
25.0000 mg | ORAL_TABLET | Freq: Every day | ORAL | 4 refills | Status: DC
  Filled 2022-10-10: qty 30, 30d supply, fill #0
  Filled 2022-11-12: qty 30, 30d supply, fill #1
  Filled 2022-12-29: qty 30, 30d supply, fill #2
  Filled 2023-02-18: qty 30, 30d supply, fill #3
  Filled 2023-04-08: qty 30, 30d supply, fill #4
  Filled 2023-05-18: qty 30, 30d supply, fill #5
  Filled 2023-06-12: qty 30, 30d supply, fill #6
  Filled 2023-07-12: qty 30, 30d supply, fill #7
  Filled 2023-09-02: qty 30, 30d supply, fill #8

## 2022-11-12 ENCOUNTER — Other Ambulatory Visit (HOSPITAL_COMMUNITY): Payer: Self-pay

## 2022-11-12 MED ORDER — FENOFIBRATE 54 MG PO TABS
ORAL_TABLET | ORAL | 3 refills | Status: DC
  Filled 2022-11-12 – 2022-12-29 (×2): qty 60, 30d supply, fill #0
  Filled 2023-02-18: qty 60, 30d supply, fill #1
  Filled 2023-04-08 (×2): qty 60, 30d supply, fill #2
  Filled 2023-06-12: qty 60, 30d supply, fill #3
  Filled 2023-07-12: qty 60, 30d supply, fill #4
  Filled 2023-09-02: qty 60, 30d supply, fill #5

## 2022-11-13 ENCOUNTER — Other Ambulatory Visit (HOSPITAL_COMMUNITY): Payer: Self-pay

## 2022-11-27 ENCOUNTER — Other Ambulatory Visit (HOSPITAL_COMMUNITY): Payer: Self-pay

## 2022-12-29 ENCOUNTER — Other Ambulatory Visit (HOSPITAL_COMMUNITY): Payer: Self-pay

## 2023-01-14 ENCOUNTER — Other Ambulatory Visit (HOSPITAL_COMMUNITY): Payer: Self-pay

## 2023-01-23 ENCOUNTER — Encounter: Payer: Self-pay | Admitting: Family

## 2023-01-23 ENCOUNTER — Inpatient Hospital Stay (HOSPITAL_BASED_OUTPATIENT_CLINIC_OR_DEPARTMENT_OTHER): Payer: 59 | Admitting: Family

## 2023-01-23 ENCOUNTER — Inpatient Hospital Stay: Payer: 59 | Attending: Hematology & Oncology

## 2023-01-23 VITALS — BP 149/89 | HR 85 | Temp 98.3°F | Resp 17 | Wt 118.1 lb

## 2023-01-23 DIAGNOSIS — D510 Vitamin B12 deficiency anemia due to intrinsic factor deficiency: Secondary | ICD-10-CM

## 2023-01-23 DIAGNOSIS — D500 Iron deficiency anemia secondary to blood loss (chronic): Secondary | ICD-10-CM | POA: Diagnosis not present

## 2023-01-23 DIAGNOSIS — E538 Deficiency of other specified B group vitamins: Secondary | ICD-10-CM | POA: Diagnosis not present

## 2023-01-23 DIAGNOSIS — K909 Intestinal malabsorption, unspecified: Secondary | ICD-10-CM

## 2023-01-23 DIAGNOSIS — N921 Excessive and frequent menstruation with irregular cycle: Secondary | ICD-10-CM | POA: Insufficient documentation

## 2023-01-23 LAB — CBC WITH DIFFERENTIAL (CANCER CENTER ONLY)
Abs Immature Granulocytes: 0 10*3/uL (ref 0.00–0.07)
Basophils Absolute: 0 10*3/uL (ref 0.0–0.1)
Basophils Relative: 1 %
Eosinophils Absolute: 0.3 10*3/uL (ref 0.0–0.5)
Eosinophils Relative: 5 %
HCT: 45.3 % (ref 36.0–46.0)
Hemoglobin: 15.9 g/dL — ABNORMAL HIGH (ref 12.0–15.0)
Immature Granulocytes: 0 %
Lymphocytes Relative: 39 %
Lymphs Abs: 2.2 10*3/uL (ref 0.7–4.0)
MCH: 30 pg (ref 26.0–34.0)
MCHC: 35.1 g/dL (ref 30.0–36.0)
MCV: 85.5 fL (ref 80.0–100.0)
Monocytes Absolute: 0.4 10*3/uL (ref 0.1–1.0)
Monocytes Relative: 6 %
Neutro Abs: 2.7 10*3/uL (ref 1.7–7.7)
Neutrophils Relative %: 49 %
Platelet Count: 243 10*3/uL (ref 150–400)
RBC: 5.3 MIL/uL — ABNORMAL HIGH (ref 3.87–5.11)
RDW: 12.4 % (ref 11.5–15.5)
WBC Count: 5.6 10*3/uL (ref 4.0–10.5)
nRBC: 0 % (ref 0.0–0.2)

## 2023-01-23 LAB — RETICULOCYTES
Immature Retic Fract: 10.9 % (ref 2.3–15.9)
RBC.: 5.25 MIL/uL — ABNORMAL HIGH (ref 3.87–5.11)
Retic Count, Absolute: 79.8 10*3/uL (ref 19.0–186.0)
Retic Ct Pct: 1.5 % (ref 0.4–3.1)

## 2023-01-23 LAB — FERRITIN: Ferritin: 58 ng/mL (ref 11–307)

## 2023-01-23 LAB — IRON AND IRON BINDING CAPACITY (CC-WL,HP ONLY)
Iron: 72 ug/dL (ref 28–170)
Saturation Ratios: 17 % (ref 10.4–31.8)
TIBC: 413 ug/dL (ref 250–450)
UIBC: 343 ug/dL

## 2023-01-23 LAB — VITAMIN B12: Vitamin B-12: 518 pg/mL (ref 180–914)

## 2023-01-23 NOTE — Progress Notes (Signed)
Hematology and Oncology Follow Up Visit  Mary Logan [id number] [birth year] 52 y.o. 01/23/2023   Principle Diagnosis:  Iron deficiency anemia - menometrorrhagia Pernicious anemia   Current Therapy:        IV iron as indicated  Vitamin B12 sublingual 2,500 mcg daily   Interim History:  Mary Logan is here today for follow-up. She is doing quite well and has no complaints at this time.  Her cycle is regular with light flow. No other blood loss noted. No bruising or petechiae.  No fever, chills, n/v, cough, rash, dizziness, SOB, chest pain, palpitations, abdominal pain or changes in bowel or bladder habits.  No swelling, tenderness, numbness or tingling in her extremities.  No falls or syncope.  Appetite and hydration are good. Weight is stable at 118 lbs.   ECOG Performance Status: 1 - Symptomatic but completely ambulatory  Medications:  Allergies as of 01/23/2023   No Known Allergies      Medication List        Accurate as of January 23, 2023  9:28 AM. If you have any questions, ask your nurse or doctor.          B-12 [phone] MCG Subl Place 2,500 mcg under the tongue daily.   ergocalciferol 1.25 MG (50000 UT) capsule Commonly known as: VITAMIN D2 Take by mouth.   fenofibrate 54 MG tablet TAKE 2 TABLETS BY MOUTH ONCE A DAY WITH FOOD   Jardiance 10 MG Tabs tablet Generic drug: empagliflozin Take 1 tablet by mouth once a day   Jardiance 10 MG Tabs tablet Generic drug: empagliflozin Take 1 tablet by mouth once a day   levothyroxine 137 MCG tablet Commonly known as: SYNTHROID TAKE 1 TABLET BY MOUTH ONCE DAILY   levothyroxine 137 MCG tablet Commonly known as: SYNTHROID TAKE 1 TABLET BY MOUTH ONCE DAILY   levothyroxine 137 MCG tablet Commonly known as: SYNTHROID TAKE 1 TABLET BY MOUTH ONCE DAILY   losartan 25 MG tablet Commonly known as: COZAAR Take 1 tablet (25 mg total) by mouth daily.   metFORMIN 500 MG 24 hr tablet Commonly known as:  GLUCOPHAGE-XR Take 1 tablet by mouth twice a day   metFORMIN 500 MG 24 hr tablet Commonly known as: GLUCOPHAGE-XR Take 1 tablet by mouth twice a day   MULTIVITAMIN ADULT PO Take by mouth daily at 6 (six) AM.   omega-3 acid ethyl esters 1 g capsule Commonly known as: LOVAZA Take 3 g by mouth 2 (two) times daily.   Synjardy 5-500 MG Tabs Generic drug: Empagliflozin-metFORMIN HCl Take 1 tablet by mouth 2 times a day   Synjardy 5-500 MG Tabs Generic drug: Empagliflozin-metFORMIN HCl Take 1 tablet by mouth 2 (two) times daily.   VITAMIN B12 PO Take 1 tablet by mouth daily.        Allergies: No Known Allergies  Past Medical History, Surgical history, Social history, and Family History were reviewed and updated.  Review of Systems: All other 10 point review of systems is negative.   Physical Exam:  weight is 118 lb 1.3 oz (53.6 kg). Her oral temperature is 98.3 F (36.8 C). Her blood pressure is 149/89 (abnormal) and her pulse is 85. Her respiration is 17 and oxygen saturation is 98%.   Wt Readings from Last 3 Encounters:  01/23/23 118 lb 1.3 oz (53.6 kg)  07/23/22 114 lb 0.6 oz (51.7 kg)  03/19/22 116 lb 1.9 oz (52.7 kg)    Ocular: Sclerae unicteric, pupils equal, round and reactive to  light Ear-nose-throat: Oropharynx clear, dentition fair Lymphatic: No cervical or supraclavicular adenopathy Lungs no rales or rhonchi, good excursion bilaterally Heart regular rate and rhythm, no murmur appreciated Abd soft, nontender, positive bowel sounds MSK no focal spinal tenderness, no joint edema Neuro: non-focal, well-oriented, appropriate affect Breasts: Deferred   Lab Results  Component Value Date   WBC 5.6 01/23/2023   HGB 15.9 (H) 01/23/2023   HCT 45.3 01/23/2023   MCV 85.5 01/23/2023   PLT 243 01/23/2023   Lab Results  Component Value Date   FERRITIN 115 07/23/2022   IRON 105 07/23/2022   TIBC 360 07/23/2022   UIBC 255 07/23/2022   IRONPCTSAT 29 07/23/2022    Lab Results  Component Value Date   RETICCTPCT 1.5 01/23/2023   RBC 5.30 (H) 01/23/2023   No results found for: "KPAFRELGTCHN", "LAMBDASER", "KAPLAMBRATIO" No results found for: "IGGSERUM", "IGA", "IGMSERUM" No results found for: "TOTALPROTELP", "ALBUMINELP", "A1GS", "A2GS", "BETS", "BETA2SER", "GAMS", "MSPIKE", "SPEI"   Chemistry      Component Value Date/Time   NA 132 (L) 11/15/2021 0917   K 4.0 11/15/2021 0917   CL 97 (L) 11/15/2021 0917   CO2 26 11/15/2021 0917   BUN 17 11/15/2021 0917   CREATININE 0.68 11/15/2021 0917      Component Value Date/Time   CALCIUM 10.0 11/15/2021 0917   ALKPHOS 64 11/15/2021 0917   AST 24 11/15/2021 0917   ALT 26 11/15/2021 0917   BILITOT 0.5 11/15/2021 0917       Impression and Plan: Mary Logan is a very pleasant 52 yo [name] female with history of iron deficiency secondary to heavy cycles and 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  Iron studies are pending.  Follow-up in 1 year.   Lottie Dawson, NP 2/21/20249:28 AM

## 2023-02-18 ENCOUNTER — Other Ambulatory Visit (HOSPITAL_COMMUNITY): Payer: Self-pay

## 2023-02-22 ENCOUNTER — Other Ambulatory Visit (HOSPITAL_COMMUNITY): Payer: Self-pay

## 2023-04-08 ENCOUNTER — Other Ambulatory Visit (HOSPITAL_COMMUNITY): Payer: Self-pay

## 2023-05-18 ENCOUNTER — Other Ambulatory Visit (HOSPITAL_COMMUNITY): Payer: Self-pay

## 2023-06-12 ENCOUNTER — Other Ambulatory Visit (HOSPITAL_COMMUNITY): Payer: Self-pay

## 2023-06-19 ENCOUNTER — Other Ambulatory Visit (HOSPITAL_COMMUNITY): Payer: Self-pay

## 2023-06-19 MED ORDER — TRIAMCINOLONE ACETONIDE 0.1 % EX CREA
1.0000 | TOPICAL_CREAM | Freq: Three times a day (TID) | CUTANEOUS | 3 refills | Status: DC
  Filled 2023-06-19: qty 15, 30d supply, fill #0

## 2023-07-12 ENCOUNTER — Other Ambulatory Visit (HOSPITAL_COMMUNITY): Payer: Self-pay

## 2023-07-13 ENCOUNTER — Other Ambulatory Visit (HOSPITAL_COMMUNITY): Payer: Self-pay

## 2023-09-02 ENCOUNTER — Other Ambulatory Visit (HOSPITAL_COMMUNITY): Payer: Self-pay

## 2023-09-02 MED ORDER — LEVOTHYROXINE SODIUM 137 MCG PO TABS
137.0000 ug | ORAL_TABLET | Freq: Every day | ORAL | 2 refills | Status: DC
Start: 2023-09-02 — End: 2024-07-28
  Filled 2023-09-02: qty 30, 30d supply, fill #0
  Filled 2023-09-30: qty 30, 30d supply, fill #1
  Filled 2023-10-29: qty 30, 30d supply, fill #2

## 2023-09-04 ENCOUNTER — Other Ambulatory Visit (HOSPITAL_COMMUNITY): Payer: Self-pay

## 2023-09-26 ENCOUNTER — Other Ambulatory Visit (HOSPITAL_COMMUNITY): Payer: Self-pay

## 2023-09-26 MED ORDER — LOSARTAN POTASSIUM 25 MG PO TABS
25.0000 mg | ORAL_TABLET | Freq: Every day | ORAL | 3 refills | Status: DC
Start: 2023-09-26 — End: 2024-07-28
  Filled 2023-09-26: qty 30, 30d supply, fill #0
  Filled 2023-10-29: qty 30, 30d supply, fill #1

## 2023-09-26 MED ORDER — FENOFIBRATE 54 MG PO TABS
108.0000 mg | ORAL_TABLET | Freq: Every day | ORAL | 3 refills | Status: DC
Start: 2023-09-26 — End: 2024-07-28
  Filled 2023-09-26: qty 60, 30d supply, fill #0
  Filled 2023-10-29: qty 60, 30d supply, fill #1
  Filled 2024-01-02 – 2024-01-06 (×2): qty 60, 30d supply, fill #2
  Filled 2024-02-04: qty 60, 30d supply, fill #3

## 2023-09-26 NOTE — Progress Notes (Signed)
 [facility] [place]  Health Maintenance Visit Franchelle Foskett DOB: 11-27-1971  MRN: [id number] Visit Date: 09/26/2023  Encounter Provider: Lucie Reus, MD  HPI Chassity Ludke is a 52 y.o. female with a PMH of f type 2 diabetes, hyperlipidemia, hypothyroidism, and history of anemia due to iron  and B12 deficiency    Presents today for a health maintenance exam.  Additional issues discussed today: menopause: In 04/2022, she entered menopause. Prior to her last period, her periods were regular. She missed a period in 05/2023 and 06/2023   #IDA 2/2 menstrual cycles c/b B12 def Saw hem/onc 01/23/23. Had IV iron  and SL b12  #DMT2 Followed by endo (Dr. Tommas) Last A1C 09/14/22: 7.1% Home BG: doesn't check Hypoglycemic events: no Meds: Synjardy  and metformin   Hx of pancreatitis during a pregnancy Health Maintenance/Complications: Microvascular Complications:             Retinopathy: Unknown. Eye exam overdue             Nephropathy: Yes. Urine microalbumin: Due             Peripheral Neuropathy Present: No Macrovascular Complications: Denies history of MI, stroke or peripheral artery disease Foot exam: Due. Last done here 09/14/22 Gastroparesis: No ACEi/ARB: Yes Statin: No  #Hypothyroidism Followed by Dr. Tommas (endocrinology).  Last visit July 2024.  Taking levothyroxine  137 mcg daily.  #Hyperlipidemia Taking fenofibrate    History of Present Illness The patient presents for a routine checkup. She is accompanied by an adult female.  She underwent a mammogram two years ago and plans to have another in January 2025.  She consulted Dr. Baylin, who conducted blood tests and found her sugar levels slightly elevated. Despite this, she was advised to continue her current medication. She is currently taking Synjardy  5 mg once daily, which was approved by Dr. Baylin. Her blood sugar levels are typically around 140. She has an upcoming appointment with Dr. Baylin in  November 2024. She has dietary restrictions due to her sugar levels and is considering a vegetarian diet. She occasionally takes iron  supplements with vitamin C.  She is also on fenofibrate  for cholesterol management. She was previously on Lipitor, which she discontinued during pregnancy. She is currently on fenofibrate , prescribed by her cardiologist for high triglycerides. She reports no swelling.  She has not menstruated since May 2024, which she attributes to menopause.  She has not had an eye examination recently, despite experiencing poor vision.  She engages in light walking but does not practice yoga or stretching.  She received a pneumonia vaccine last year.    HEALTH MAINTENANCE:  SCREENING TESTS: ---Hep C screening: UTD,, nonreactive on 05/24/2021  ---Hep B screening: UTD ---STD screening: negative during 2013 pregnancy. Declines additional screens  ---Breast cancer screening: due ---Cervical cancer screening: Up-to-date, next due June 2027. Last Pap 05/24/2021 NILM with negative high risk HPV. Denies history of abnormal Pap  ---Colon cancer screening: UTD, next due Oct 2026. Negative Cologuard Oct 2023 ---Lung cancer screening: n/a ---Bone density: age 5  LIFESTYLE:  ---Exercise: no ---Diet: diabetic diet ---EtOH use: no  Immunizations:  Immunization History  Administered Date(s) Administered  . Pfizer SARS-CoV-2 Primary Series 12+ yrs 03/26/2020, 05/06/2020, 01/22/2021  . Pneumococcal Conjugate 20-Valent (PCV20) 09/14/2022  . Tdap 09/26/2023    MEDICATIONS: Current Outpatient Medications on File Prior to Visit  Medication Sig Dispense Refill  . Accu-Chek Softclix Lancets misc     . cholecalciferol (VITAMIN D3) 10 mcg (400 unit) tablet Take 2,000 Units by  mouth Once Daily.    . empagliflozin -metFORMIN  (Synjardy ) 5-500 mg tab Take 1 tablet by mouth daily.    . levothyroxine  (SYNTHROID ) 137 mcg tablet Take 137 mcg by mouth Once Daily.    . multivit-min/folic  acid/vit K1 (MULTI FOR HER 50 PLUS ORAL) Take by mouth.    . omega 3-dha-epa-fish oil (OMEGA 3) 1,000 mg capsule Take 1 g by mouth Once Daily.    . triamcinolone  acetonide (KENALOG ) 0.1 % cream Apply topically 3 (three) times a day. Until resolution (up to 4 weeks) then taper over 2 weeks. Use vaseline as well. 15 g 3   No current facility-administered medications on file prior to visit.    Medical History:  Past Medical History:  Diagnosis Date  . Iron  deficiency 05/02/2021   Required IV iron  and blood transfusion  . Pancreatitis    during a pregnancy  . Pure hyperglyceridemia 05/02/2021  . Vitamin B12 deficiency (non anemic) 07/19/2022    Patient Active Problem List   Diagnosis Date Noted  . 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 Hyperlipidemia 05/02/2021  . Hypothyroidism 05/02/2021  . Vitamin D deficiency 05/02/2021  . Type 2 diabetes mellitus with microalbuminuria, without long-term current use of insulin  (HCC) 05/02/2021  . PCOS (polycystic ovarian syndrome) 08/20/2017    Allergies: No Known Allergies   Surgical History-  Past Surgical History:  Procedure Laterality Date  . CESAREAN SECTION, UNSPECIFIED     Procedure: CESAREAN SECTION    Family history-  Family History  Problem Relation Name Age of Onset  . Hypertension Mother    . Heart attack Mother    . Cancer Father         kidney  . Colon cancer Neg Hx    . Breast cancer Neg Hx      Physical Exam: BP 100/76   Pulse 75   Temp 97.9 F (36.6 C)   Resp 16   Ht 1.52 m (4' 11.84)   Wt 52.3 kg (115 lb 3.2 oz)   SpO2 98%   BMI 22.62 kg/m   Physical Exam Vitals reviewed.  Constitutional:      General: She is not in acute distress.    Appearance: Normal appearance. She is not ill-appearing or diaphoretic.  HENT:     Head: Normocephalic and atraumatic.     Mouth/Throat:     Mouth: Mucous membranes are moist. No oral lesions.      Tongue: No lesions.     Palate: No lesions.     Pharynx: Oropharynx is clear.  Eyes:     General: No scleral icterus.    Extraocular Movements: Extraocular movements intact.     Conjunctiva/sclera: Conjunctivae normal.  Neck:     Thyroid : No thyroid  mass, thyromegaly or thyroid  tenderness.     Trachea: Trachea normal.  Cardiovascular:     Rate and Rhythm: Normal rate and regular rhythm.     Pulses: Normal pulses.     Heart sounds: Normal heart sounds. No murmur heard.    No friction rub. No gallop.  Pulmonary:     Effort: Pulmonary effort is normal.     Breath sounds: Normal breath sounds. No wheezing, rhonchi or rales.  Abdominal:     General: There is no distension.     Palpations: Abdomen is soft. There is no mass.     Tenderness: There is no abdominal tenderness. There is no guarding.  Musculoskeletal:  Cervical back: Normal range of motion and neck supple.     Right lower leg: No edema.     Left lower leg: No edema.  Lymphadenopathy:     Cervical: No cervical adenopathy.  Skin:    General: Skin is warm and dry.     Capillary Refill: Capillary refill takes less than 2 seconds.     Coloration: Skin is not jaundiced or pale.     Findings: No bruising, erythema or rash.  Neurological:     Mental Status: She is alert and oriented to person, place, and time. Mental status is at baseline.  Psychiatric:        Mood and Affect: Mood normal.        Behavior: Behavior normal.        Thought Content: Thought content normal.        Judgment: Judgment normal.     Physical Exam     Labs:  Results Laboratory Studies Blood sugar was slightly elevated.    Assessment and Plan:  Age appropriate wellness instructions reviewed.   Assessment & Plan 1. Health Maintenance. A screening mammogram will be scheduled for January after the patient switches to her new insurance. A tetanus vaccine will be administered today. A referral to an optometrist, Inocente Pao, will be made  for a diabetic eye exam in January.  2. Diabetes Mellitus. The patient is currently taking Synjardy  500 mg once a day. She reports her blood sugar levels are around 140, which could be better. She will continue her current dosage and follow up with Dr. Baylin in November for further evaluation. She is advised to limit her sugar and carbohydrate intake.  3. Hyperlipidemia. The patient is taking fenofibrate  for her triglycerides. She reports inconsistent use of her statin due to perceived side effects. She is advised to discuss her medication regimen with Dr. Baylin during her next visit in November.  4. Menopause. The patient has not had a menstrual period since May and is experiencing symptoms suggestive of menopause. Hormone replacement therapy was discussed, including the risks (increased risk of breast cancer, uterine cancer, blood clots) and benefits (decreased risk of osteoporosis and heart disease). She is advised to monitor her symptoms and consider treatment if they become bothersome.  5. Anemia. The patient reports a history of anemia and is currently taking iron  supplements with vitamin C. She will follow up with her hematologist at the start of the year for repeat iron  and B12 testing.       No follow-ups on file.   There are no Patient Instructions on file for this visit.    I have personally spent 45 minutes involved in face-to-face and non-face-to-face activities for this patient on the day of the visit in addition to the time spent on annual wellness/preventative care.  Professional time spent includes the following activities, in addition to those noted in the documentation:  - preparing to see the patient (e.g., review of recent and/or remote lab/imaging/study results, provider notes, and patient messages/phone calls available in current EMR, CareEverywhere, and scanned records) -obtaining and/or reviewing separately obtained history either through past provider notes,  patient phone calls, and/or patient's family member(s)/caregiver(s) -performing a medically appropriate examination and/or evaluation -counseling and educating the patient/family/caregiver -ordering medications, tests, or procedures -documenting clinical information in the electronic or other health record -reviewing most up to date studies or expert consensus guidelines for screening/diagnosing/treating pertinent conditions/symptoms -independently interpreting results (not separately reported) and communicating results to the patient/family/caregiver -care coordination (  not separately reported) -referring and communicating with other health care professionals (when not separately reported)   Lucie Reus 8:14 AM

## 2023-09-30 ENCOUNTER — Other Ambulatory Visit (HOSPITAL_COMMUNITY): Payer: Self-pay

## 2023-10-29 ENCOUNTER — Other Ambulatory Visit: Payer: Self-pay

## 2023-10-29 ENCOUNTER — Other Ambulatory Visit (HOSPITAL_COMMUNITY): Payer: Self-pay

## 2023-10-30 ENCOUNTER — Other Ambulatory Visit (HOSPITAL_COMMUNITY): Payer: Self-pay

## 2023-12-30 ENCOUNTER — Other Ambulatory Visit (HOSPITAL_COMMUNITY): Payer: Self-pay

## 2023-12-30 ENCOUNTER — Encounter: Payer: Self-pay | Admitting: Hematology & Oncology

## 2023-12-30 MED ORDER — FENOFIBRATE 134 MG PO CAPS
134.0000 mg | ORAL_CAPSULE | Freq: Every day | ORAL | 5 refills | Status: AC
  Filled 2023-12-30: qty 30, 30d supply, fill #0
  Filled 2024-09-02: qty 90, 90d supply, fill #0
  Filled 2024-12-02: qty 90, 90d supply, fill #1

## 2023-12-31 ENCOUNTER — Encounter: Payer: Self-pay | Admitting: Hematology & Oncology

## 2023-12-31 ENCOUNTER — Other Ambulatory Visit (HOSPITAL_COMMUNITY): Payer: Self-pay

## 2024-01-01 ENCOUNTER — Other Ambulatory Visit (HOSPITAL_COMMUNITY): Payer: Self-pay

## 2024-01-01 ENCOUNTER — Other Ambulatory Visit (HOSPITAL_BASED_OUTPATIENT_CLINIC_OR_DEPARTMENT_OTHER): Payer: Self-pay

## 2024-01-01 ENCOUNTER — Encounter: Payer: Self-pay | Admitting: Hematology & Oncology

## 2024-01-01 MED ORDER — LEVOTHYROXINE SODIUM 137 MCG PO TABS
137.0000 ug | ORAL_TABLET | Freq: Every morning | ORAL | 0 refills | Status: DC
  Filled 2024-01-01: qty 90, 90d supply, fill #0

## 2024-01-01 MED ORDER — LOSARTAN POTASSIUM 25 MG PO TABS
25.0000 mg | ORAL_TABLET | Freq: Every day | ORAL | 0 refills | Status: DC
  Filled 2024-01-01: qty 90, 90d supply, fill #0

## 2024-01-01 MED ORDER — FENOFIBRATE 134 MG PO CAPS
134.0000 mg | ORAL_CAPSULE | Freq: Every day | ORAL | 5 refills | Status: DC
  Filled 2024-01-01: qty 90, 90d supply, fill #0

## 2024-01-01 MED ORDER — SYNJARDY 5-500 MG PO TABS
1.0000 | ORAL_TABLET | Freq: Two times a day (BID) | ORAL | 0 refills | Status: DC
Start: 2024-01-01 — End: 2024-07-28
  Filled 2024-01-01: qty 180, 90d supply, fill #0
  Filled 2024-01-01: qty 60, 30d supply, fill #0

## 2024-01-02 ENCOUNTER — Encounter: Payer: Self-pay | Admitting: Hematology & Oncology

## 2024-01-02 ENCOUNTER — Other Ambulatory Visit (HOSPITAL_COMMUNITY): Payer: Self-pay

## 2024-01-06 ENCOUNTER — Encounter: Payer: Self-pay | Admitting: Hematology & Oncology

## 2024-01-06 ENCOUNTER — Other Ambulatory Visit (HOSPITAL_COMMUNITY): Payer: Self-pay

## 2024-01-06 ENCOUNTER — Other Ambulatory Visit: Payer: Self-pay

## 2024-01-06 MED ORDER — SYNJARDY 5-500 MG PO TABS
1.0000 | ORAL_TABLET | Freq: Two times a day (BID) | ORAL | 0 refills | Status: DC
  Filled 2024-01-06: qty 180, 90d supply, fill #0

## 2024-01-06 MED ORDER — FENOFIBRATE 134 MG PO CAPS
134.0000 mg | ORAL_CAPSULE | Freq: Every day | ORAL | 5 refills | Status: DC
Start: 2024-01-06 — End: 2024-07-28
  Filled 2024-01-06 – 2024-01-07 (×2): qty 30, 30d supply, fill #0
  Filled 2024-02-04 – 2024-02-12 (×2): qty 30, 30d supply, fill #1

## 2024-01-07 ENCOUNTER — Other Ambulatory Visit (HOSPITAL_COMMUNITY): Payer: Self-pay

## 2024-01-08 ENCOUNTER — Other Ambulatory Visit (HOSPITAL_COMMUNITY): Payer: Self-pay

## 2024-01-14 ENCOUNTER — Other Ambulatory Visit (HOSPITAL_COMMUNITY): Payer: Self-pay

## 2024-01-15 ENCOUNTER — Other Ambulatory Visit (HOSPITAL_COMMUNITY): Payer: Self-pay

## 2024-01-15 MED ORDER — SYNJARDY 5-500 MG PO TABS
1.0000 | ORAL_TABLET | Freq: Two times a day (BID) | ORAL | 0 refills | Status: DC
  Filled 2024-02-04 – 2024-02-12 (×2): qty 180, 90d supply, fill #0

## 2024-01-15 MED ORDER — ACCU-CHEK GUIDE TEST VI STRP
ORAL_STRIP | Freq: Every day | 3 refills | Status: AC
Start: 2024-01-14 — End: ?
  Filled 2024-01-15 (×2): qty 100, 90d supply, fill #0

## 2024-01-16 ENCOUNTER — Other Ambulatory Visit: Payer: Self-pay

## 2024-01-16 ENCOUNTER — Other Ambulatory Visit (HOSPITAL_COMMUNITY): Payer: Self-pay

## 2024-01-16 MED ORDER — FREESTYLE LIBRE 3 PLUS SENSOR MISC
1.0000 | 1 refills | Status: DC
Start: 2024-01-16 — End: 2024-09-14
  Filled 2024-01-16: qty 2, 30d supply, fill #0

## 2024-01-16 MED ORDER — HUMULIN N KWIKPEN 100 UNIT/ML ~~LOC~~ SUPN
6.0000 [IU] | PEN_INJECTOR | Freq: Every day | SUBCUTANEOUS | 1 refills | Status: DC
Start: 2024-01-16 — End: 2024-07-28
  Filled 2024-01-16: qty 3, 14d supply, fill #0

## 2024-01-16 MED ORDER — INSULIN PEN NEEDLE 32G X 4 MM MISC
Freq: Every day | 5 refills | Status: AC
  Filled 2024-01-16: qty 100, 90d supply, fill #0

## 2024-01-17 ENCOUNTER — Encounter: Payer: Self-pay | Admitting: Hematology & Oncology

## 2024-01-24 ENCOUNTER — Inpatient Hospital Stay: Payer: Medicaid Other | Attending: Family

## 2024-01-24 ENCOUNTER — Inpatient Hospital Stay: Payer: Medicaid Other | Admitting: Family

## 2024-02-04 ENCOUNTER — Encounter: Payer: Self-pay | Admitting: Hematology & Oncology

## 2024-02-04 ENCOUNTER — Other Ambulatory Visit (HOSPITAL_COMMUNITY): Payer: Self-pay

## 2024-02-06 ENCOUNTER — Other Ambulatory Visit (HOSPITAL_COMMUNITY): Payer: Self-pay

## 2024-02-12 ENCOUNTER — Other Ambulatory Visit: Payer: Self-pay

## 2024-02-12 ENCOUNTER — Other Ambulatory Visit (HOSPITAL_COMMUNITY): Payer: Self-pay

## 2024-02-12 ENCOUNTER — Encounter: Payer: Self-pay | Admitting: Hematology & Oncology

## 2024-02-12 MED ORDER — FENOFIBRATE 134 MG PO CAPS
134.0000 mg | ORAL_CAPSULE | Freq: Every day | ORAL | 5 refills | Status: DC
  Filled 2024-02-12: qty 90, 90d supply, fill #0
  Filled 2024-05-27: qty 90, 90d supply, fill #1

## 2024-02-12 MED ORDER — SYNJARDY 5-500 MG PO TABS
1.0000 | ORAL_TABLET | Freq: Two times a day (BID) | ORAL | 5 refills | Status: AC
  Filled 2024-02-12 – 2024-05-27 (×2): qty 180, 90d supply, fill #0
  Filled 2024-09-02: qty 180, 90d supply, fill #1

## 2024-02-12 MED ORDER — LEVOTHYROXINE SODIUM 137 MCG PO TABS
137.0000 ug | ORAL_TABLET | Freq: Every day | ORAL | 5 refills | Status: DC
  Filled 2024-02-12: qty 90, 90d supply, fill #0
  Filled 2024-05-27: qty 90, 90d supply, fill #1

## 2024-02-12 MED ORDER — LOSARTAN POTASSIUM 25 MG PO TABS
25.0000 mg | ORAL_TABLET | Freq: Every day | ORAL | 5 refills | Status: DC
  Filled 2024-02-12: qty 90, 90d supply, fill #0
  Filled 2024-05-27: qty 90, 90d supply, fill #1

## 2024-02-12 MED ORDER — HUMULIN N KWIKPEN 100 UNIT/ML ~~LOC~~ SUPN
6.0000 [IU] | PEN_INJECTOR | Freq: Every day | SUBCUTANEOUS | 1 refills | Status: DC
  Filled 2024-02-12: qty 9, 56d supply, fill #0
  Filled 2024-02-17: qty 9, 84d supply, fill #0
  Filled 2024-05-27: qty 9, 84d supply, fill #1

## 2024-02-12 MED ORDER — FREESTYLE LIBRE 3 PLUS SENSOR MISC
1 refills | Status: DC
  Filled 2024-02-12 – 2024-02-17 (×3): qty 2, 30d supply, fill #0
  Filled 2024-05-27: qty 2, 30d supply, fill #1

## 2024-02-12 MED ORDER — PEN NEEDLES 32G X 4 MM MISC
5 refills | Status: AC
  Filled 2024-02-12: qty 100, 100d supply, fill #0

## 2024-02-13 ENCOUNTER — Other Ambulatory Visit: Payer: Self-pay

## 2024-02-15 ENCOUNTER — Other Ambulatory Visit (HOSPITAL_COMMUNITY): Payer: Self-pay

## 2024-02-17 ENCOUNTER — Other Ambulatory Visit (HOSPITAL_COMMUNITY): Payer: Self-pay

## 2024-02-17 ENCOUNTER — Other Ambulatory Visit: Payer: Self-pay

## 2024-05-27 ENCOUNTER — Other Ambulatory Visit (HOSPITAL_COMMUNITY): Payer: Self-pay

## 2024-05-27 ENCOUNTER — Other Ambulatory Visit: Payer: Self-pay

## 2024-07-22 ENCOUNTER — Other Ambulatory Visit (HOSPITAL_COMMUNITY): Payer: Self-pay

## 2024-07-27 ENCOUNTER — Encounter (HOSPITAL_BASED_OUTPATIENT_CLINIC_OR_DEPARTMENT_OTHER): Payer: Self-pay

## 2024-07-27 ENCOUNTER — Encounter: Payer: Self-pay | Admitting: Hematology & Oncology

## 2024-07-27 NOTE — Progress Notes (Signed)
 Cardiology Office Note:  .   Date:  07/28/2024  ID:  Mary Logan, DOB [birth year], MRN [id number] PCP: Tommas Pears, MD  [facility] Providers Cardiologist:  None {  History of Present Illness: .   Mary Logan is a 53 y.o. female with PMH type II diabetes, hypertriglyceridemia, hypothyroidism, anemia who is seen as a new patient at the request of Dr. Balan for anemia and family history of heart disease.   Today: Referral from 04/02/24 reviewed. Reason for referral listed as iron  deficiency anemia and family history of heart disease. No recent notes available, available notes from family medicine reviewed, last 09/26/23.   Her husband was concerned that she hasn't had her heart checked. Mother had heart attack at age 92. Had a lot of stress. Her sisters and brother have high triglycerides but have not had heart issues.   Walks regularly, does not need to stop. Sometimes when she is hiking/going up hills she feels tired.   Reviewed Tchol 214, HDL 27, LDL 76, TG 697. Had fenofibrate  increased after this test, pending recheck. Has been on lovaza  as well. Has history of pancreatitis during pregnancy about 16 years ago. Has had TG greater than 1000 in the past. Now much better about diet. Has never been on a statin.  Has never had other cardiac testing.  ROS: Denies chest pain, shortness of breath at rest or with normal exertion. No PND, orthopnea, LE edema or unexpected weight gain. No syncope or palpitations. ROS otherwise negative except as noted.   Studies Reviewed: SABRA    EKG:  EKG Interpretation Date/Time:  Tuesday July 28 2024 15:03:56 EDT Ventricular Rate:  94 PR Interval:  164 QRS Duration:  78 QT Interval:  374 QTC Calculation: 467 R Axis:   75  Text Interpretation: Normal sinus rhythm Low septal forces Confirmed by Lonni Slain [phone]) on 07/28/2024 3:21:39 PM    Physical Exam:   VS:  BP 120/84   Pulse 94   Ht 5' (1.524 m)   Wt 115 lb 6.4 oz (52.3  kg)   SpO2 99%   BMI 22.54 kg/m    Wt Readings from Last 3 Encounters:  07/28/24 115 lb 6.4 oz (52.3 kg)  01/23/23 118 lb 1.3 oz (53.6 kg)  07/23/22 114 lb 0.6 oz (51.7 kg)    GEN: Well nourished, well developed in no acute distress HEENT: Normal, moist mucous membranes NECK: No JVD CARDIAC: regular rhythm, normal S1 and S2, no rubs or gallops. No murmur. VASCULAR: Radial and DP pulses 2+ bilaterally. No carotid bruits RESPIRATORY:  Clear to auscultation without rales, wheezing or rhonchi  ABDOMEN: Soft, non-tender, non-distended MUSCULOSKELETAL:  Ambulates independently SKIN: Warm and dry, no edema NEUROLOGIC:  Alert and oriented x 3. No focal neuro deficits noted. PSYCHIATRIC:  Normal affect    ASSESSMENT AND PLAN: .    Hypertriglyceridemia Family history of high triglycerides Family history of premature CAD (mother) -she reports personal history of TG >1000 and pancreatitis when pregnant -mother with MI age 79, siblings with high TG but no known heart issues -reviewed recent labs -on fibrate and lovaza , has not been on statin in the past -discussed that I think she would be a good candidate for evaluation in the advanced lipid clinic given her history. Referred to Dr. Mona today.  Type II diabetes Hypothyroidism -followed by Dr. Tommas. She is aware that both of these can affect her TG but has been stable recently -defer to Dr. Katharina further management, but would  consider statin given diabetes -on Jardiance , metformin , humulin  insulin  for diabetes. Last A1c I see is 8.2 -on levothyroxine  for hypothyoidism. Last TSH 1.41  CV risk counseling and prevention -recommend heart healthy/Mediterranean diet, with whole grains, fruits, vegetable, fish, lean meats, nuts, and olive oil. Limit salt. -recommend moderate walking, 3-5 times/week for 30-50 minutes each session. Aim for at least 150 minutes/week. Goal should be pace of 3 miles/hours, or walking 1.5 miles in 30  minutes -recommend avoidance of tobacco products. Avoid excess alcohol. -ASCVD risk score: The 10-year ASCVD risk score (Arnett DK, et al., 2019) is: 6.2%   Values used to calculate the score:     Age: 59 years     Clincally relevant sex: Female     Is Non-Hispanic African American: No     Diabetic: Yes     Tobacco smoker: No     Systolic Blood Pressure: 120 mmHg     Is BP treated: No     HDL Cholesterol: 29 MG/DL     Total Cholesterol: 225 MG/DL    Dispo: will refer to Dr. Mona for advanced lipid clinic evaluation  Signed, Mary Bruckner, MD   Mary Bruckner, MD, PhD, [facility]  [facility]  [place]  Heart & Vascular at [facility] [address], [address] [place], [place] [address] [phone]

## 2024-07-28 ENCOUNTER — Encounter (HOSPITAL_BASED_OUTPATIENT_CLINIC_OR_DEPARTMENT_OTHER): Payer: Self-pay | Admitting: Cardiology

## 2024-07-28 ENCOUNTER — Ambulatory Visit (INDEPENDENT_AMBULATORY_CARE_PROVIDER_SITE_OTHER): Admitting: Cardiology

## 2024-07-28 VITALS — BP 120/84 | HR 94 | Ht 60.0 in | Wt 115.4 lb

## 2024-07-28 DIAGNOSIS — E119 Type 2 diabetes mellitus without complications: Secondary | ICD-10-CM | POA: Diagnosis not present

## 2024-07-28 DIAGNOSIS — Z8249 Family history of ischemic heart disease and other diseases of the circulatory system: Secondary | ICD-10-CM

## 2024-07-28 DIAGNOSIS — E039 Hypothyroidism, unspecified: Secondary | ICD-10-CM | POA: Diagnosis not present

## 2024-07-28 DIAGNOSIS — Z7189 Other specified counseling: Secondary | ICD-10-CM

## 2024-07-28 DIAGNOSIS — Z794 Long term (current) use of insulin: Secondary | ICD-10-CM

## 2024-07-28 DIAGNOSIS — E781 Pure hyperglyceridemia: Secondary | ICD-10-CM

## 2024-07-28 NOTE — Patient Instructions (Signed)
 Medication Instructions:   Your physician recommends that you continue on your current medications as directed. Please refer to the Current Medication list given to you today.   *If you need a refill on your cardiac medications before your next appointment, please call your pharmacy*  Lab Work:  None ordered.  If you have labs (blood work) drawn today and your tests are completely normal, you will receive your results only by: MyChart Message (if you have MyChart) OR A paper copy in the mail If you have any lab test that is abnormal or we need to change your treatment, we will call you to review the results.  Testing/Procedures:  None ordered.  Follow-Up: At [facility], you and your health needs are our priority.  As part of our continuing mission to provide you with exceptional heart care, our providers are all part of one team.  This team includes your primary Cardiologist (physician) and Advanced Practice Providers or APPs (Physician Assistants and Nurse Practitioners) who all work together to provide you with the care you need, when you need it.  Your next appointment:   2 month(s)  Provider:   K. Italy Hilty, MD    We recommend signing up for the patient portal called MyChart.  Sign up information is provided on this After Visit Summary.  MyChart is used to connect with patients for Virtual Visits (Telemedicine).  Patients are able to view lab/test results, encounter notes, upcoming appointments, etc.  Non-urgent messages can be sent to your provider as well.   To learn more about what you can do with MyChart, go to [email or web address].   Other Instructions  You have been referred to the lipid clinic.

## 2024-09-02 ENCOUNTER — Other Ambulatory Visit (HOSPITAL_COMMUNITY): Payer: Self-pay

## 2024-09-02 ENCOUNTER — Other Ambulatory Visit: Payer: Self-pay

## 2024-09-02 MED ORDER — LOSARTAN POTASSIUM 25 MG PO TABS
25.0000 mg | ORAL_TABLET | Freq: Every day | ORAL | 0 refills | Status: DC
  Filled 2024-09-02: qty 30, 30d supply, fill #0

## 2024-09-02 MED ORDER — LEVOTHYROXINE SODIUM 137 MCG PO TABS
137.0000 ug | ORAL_TABLET | Freq: Every day | ORAL | 0 refills | Status: DC
  Filled 2024-09-02: qty 30, 30d supply, fill #0

## 2024-09-03 ENCOUNTER — Other Ambulatory Visit (HOSPITAL_COMMUNITY): Payer: Self-pay

## 2024-09-03 MED ORDER — FREESTYLE LIBRE 3 SENSOR MISC
1.0000 [IU] | 1 refills | Status: AC
  Filled 2024-09-03: qty 2, 30d supply, fill #0
  Filled 2024-10-14 – 2024-12-02 (×2): qty 2, 30d supply, fill #1

## 2024-09-14 ENCOUNTER — Other Ambulatory Visit (HOSPITAL_COMMUNITY): Payer: Self-pay

## 2024-09-14 ENCOUNTER — Ambulatory Visit (INDEPENDENT_AMBULATORY_CARE_PROVIDER_SITE_OTHER): Admitting: Internal Medicine

## 2024-09-14 VITALS — BP 120/80 | HR 93 | Ht 60.0 in | Wt 112.0 lb

## 2024-09-14 DIAGNOSIS — E781 Pure hyperglyceridemia: Secondary | ICD-10-CM | POA: Diagnosis not present

## 2024-09-14 DIAGNOSIS — E039 Hypothyroidism, unspecified: Secondary | ICD-10-CM | POA: Diagnosis not present

## 2024-09-14 DIAGNOSIS — Z8719 Personal history of other diseases of the digestive system: Secondary | ICD-10-CM | POA: Diagnosis not present

## 2024-09-14 DIAGNOSIS — E119 Type 2 diabetes mellitus without complications: Secondary | ICD-10-CM

## 2024-09-14 DIAGNOSIS — Z794 Long term (current) use of insulin: Secondary | ICD-10-CM

## 2024-09-14 NOTE — Patient Instructions (Addendum)
 Medication Instructions:  NO CHANGES today   *If you need a refill on your cardiac medications before your next appointment, please call your pharmacy*  Lab Work: THIRD FLOOR NMR lipoprofile LPa Direct LDL TSH Free T4 If you have labs (blood work) drawn today and your tests are completely normal, you will receive your results only by: MyChart Message (if you have MyChart) OR A paper copy in the mail If you have any lab test that is abnormal or we need to change your treatment, we will call you to review the results.  Follow-Up: At [facility], you and your health needs are our priority.  As part of our continuing mission to provide you with exceptional heart care, our providers are all part of one team.  This team includes your primary Cardiologist (physician) and Advanced Practice Providers or APPs (Physician Assistants and Nurse Practitioners) who all work together to provide you with the care you need, when you need it.  Your next appointment:    AS NEEDED with Dr. Mona  We recommend signing up for the patient portal called MyChart.  Sign up information is provided on this After Visit Summary.  MyChart is used to connect with patients for Virtual Visits (Telemedicine).  Patients are able to view lab/test results, encounter notes, upcoming appointments, etc.  Non-urgent messages can be sent to your provider as well.   To learn more about what you can do with MyChart, go to [email or web address].   Other Instructions  Genetic Testing ICD-10 (diagnosis code): E78.1 CPT codes for the Dyslipidemia and ASCVD panel are as follows: 81401, 81405, 81406.

## 2024-09-14 NOTE — Progress Notes (Signed)
 LIPID CLINIC CONSULT NOTE  Chief Complaint:  High triglycerides  Primary Care Physician: Tommas Pears, MD  Primary Cardiologist:  Shelda Bruckner, MD  HPI:  Mary Logan is a 53 y.o. female who is being seen today for the evaluation of high triglycerides at the request of Shelda Bruckner, MD, PHD. This is a pleasant 53 year old [place] female with a history of high triglycerides and elevated cholesterol.  In January 2025 her total cholesterol was 676 with a triglyceride of 4910, HDL 12 and LDL could not be calculated.  More recently in March of this year total cholesterol was 214 with triglycerides 697, HDL 27 and LDL 76.  She does have a history of pancreatitis that occurred during her pregnancy a number of years ago.  She is also had poorly controlled diabetes but recently went on the continuous glucose monitoring along with some medication adjustments.  She is followed closely by Dr. Tommas who also manages her thyroid .  She has been on fenofibrate  and Lovaza  but not previously on a statin for unknown reasons.  There is family history of premature coronary artery disease in her mother.  She had apparently had to have an MI at age 60.  Other siblings have high triglycerides as well.  PMHx:  Past Medical History:  Diagnosis Date   Diabetes mellitus    GDM - insulin    Goals of care, counseling/discussion 06/02/2021   History of PCOS    tx with metformin    Hypertriglyceridemia    diet controlled   Hypothyroidism    Iron  deficiency anemia due to chronic blood loss 06/02/2021   Iron  malabsorption 06/02/2021   Maternal anemia complicating pregnancy, childbirth, or the puerperium 06/24/2012   Pernicious anemia 06/02/2021   Postpartum care following cesarean delivery (7/22) 06/24/2012   Pregnancy And Insulin -Dependent Diabetes Mellitus (delivered) 06/24/2012   Thyroid  disease     Past Surgical History:  Procedure Laterality Date   CESAREAN SECTION     x 2   CESAREAN  SECTION  06/23/2012   Procedure: CESAREAN SECTION;  Surgeon: Marie-Lyne Lavoie, MD;  Location: [facility];  Service: Gynecology;  Laterality: N/A;  EDD: 06/29/12    FAMHx:  Family History  Problem Relation Age of Onset   Diabetes Mother    Diabetes Brother    Breast cancer Neg Hx     SOCHx:   reports that she has never smoked. She has never used smokeless tobacco. She reports that she does not drink alcohol and does not use drugs.  ALLERGIES:  No Known Allergies  ROS: Pertinent items noted in HPI and remainder of comprehensive ROS otherwise negative.  HOME MEDS: Current Outpatient Medications on File Prior to Visit  Medication Sig Dispense Refill   Accu-Chek Softclix Lancets lancets      Cobalamin  Combinations (B-12) [phone] MCG SUBL Place 2,500 mcg under the tongue daily. 90 tablet 2   Continuous Glucose Sensor (FREESTYLE LIBRE 3 SENSOR) MISC Apply as directed every 15 days 6 each 1   Empagliflozin -metFORMIN  HCl (SYNJARDY ) 5-500 MG TABS Take 1 tablet by mouth 2 (two) times daily before a meal. 180 tablet 5   ergocalciferol (VITAMIN D2) 1.25 MG (50000 UT) capsule Take by mouth.     fenofibrate  micronized (LOFIBRA) 134 MG capsule Take 1 capsule (134 mg total) by mouth daily with meal 90 capsule 5   glucose blood (ACCU-CHEK GUIDE TEST) test strip Use to monitor glucose daily. 100 strip 3   Insulin  Pen Needle (PEN NEEDLES) 32G X 4  MM MISC Use as directed once a day. 100 each 5   Insulin  Pen Needle 32G X 4 MM MISC Use once daily. 50 each 5   levothyroxine  (SYNTHROID ) 137 MCG tablet Take 1 tablet (137 mcg total) by mouth daily in the morning on an empty stomach. 30 tablet 0   losartan  (COZAAR ) 25 MG tablet Take 1 tablet (25 mg total) by mouth daily. 30 tablet 0   Multiple Vitamin (MULTIVITAMIN ADULT PO) Take by mouth daily at 6 (six) AM.     Multiple Vitamins-Minerals (MULTI FOR HER) TABS as needed.     omega-3 acid ethyl esters (LOVAZA ) 1 g capsule Take 3 g by mouth 2 (two) times daily.      No current facility-administered medications on file prior to visit.    LABS/IMAGING: No results found for this or any previous visit (from the past 48 hours). No results found.  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  Her triglycerides have been close to 5000 about a year ago.  She did have a remote episode of pancreatitis.  Diabetes apparently is better controlled now.  Would like to update her lipids since her labs were in March.  She has also requested A1c and a recheck of her thyroid .  She is fasting and we will order that today including NMR and LP(a).  I also offered her genetic testing which would help us  to determine whether or not she has FCS.  This could be helpful to get approval for Demetria which was recently FDA approved for triglyceride lowering but only for patients with genetically proven FCS.  She wishes to consider that given possible cost issues.  I also mention that she might qualify for an upcoming clinical trial that we are starting to enroll patients in.  She did not commit as to whether or not she was interested in but wanted to discuss  it further with her husband.  I agree that she should be on statin therapy with her history of diabetes and high triglycerides.  Most likely after repeating her lipids I would suggest starting on statin therapy in addition to her fibrate and omega-3's.  Thanks again for the kind referral.  Vinie KYM Maxcy, MD, [facility], FNLA, FACP  [place]  [facility]  Medical Director of the [facility]  [facility] Diplomate of the American Board of Clinical Lipidology Attending Cardiologist  Direct Dial: [phone]  Fax: [phone]  Website:  [email or web address] Vinie BROCKS Jacquan Savas 09/14/2024, 2:44 PM

## 2024-09-15 ENCOUNTER — Other Ambulatory Visit (HOSPITAL_COMMUNITY): Payer: Self-pay

## 2024-09-16 ENCOUNTER — Other Ambulatory Visit (HOSPITAL_COMMUNITY): Payer: Self-pay

## 2024-09-16 LAB — HEMOGLOBIN A1C
Est. average glucose Bld gHb Est-mCnc: 171 mg/dL
Hgb A1c MFr Bld: 7.6 % — ABNORMAL HIGH (ref 4.8–5.6)

## 2024-09-17 LAB — NMR, LIPOPROFILE
Cholesterol, Total: 207 mg/dL — ABNORMAL HIGH (ref 100–199)
HDL Particle Number: 32.5 umol/L (ref >=30.5)
HDL-C: 35 mg/dL — ABNORMAL LOW (ref >39)
LDL Particle Number: 1499 nmol/L — ABNORMAL HIGH (ref <1000)
LDL Size: 19.6 nm — ABNORMAL LOW (ref >20.5)
LDL-C (NIH Calc): 106 mg/dL — ABNORMAL HIGH (ref 0–99)
LP-IR Score: 46 — ABNORMAL HIGH (ref <=45)
Small LDL Particle Number: 1173 nmol/L — ABNORMAL HIGH (ref <=527)
Triglycerides: 388 mg/dL — ABNORMAL HIGH (ref 0–149)

## 2024-09-17 LAB — LIPOPROTEIN A (LPA): Lipoprotein (a): 51 nmol/L (ref <75.0)

## 2024-09-17 LAB — TSH: TSH: 1.4 u[IU]/mL (ref 0.450–4.500)

## 2024-09-17 LAB — LDL CHOLESTEROL, DIRECT: LDL Direct: 98 mg/dL (ref 0–99)

## 2024-09-17 LAB — T4, FREE: Free T4: 1.41 ng/dL (ref 0.82–1.77)

## 2024-09-21 ENCOUNTER — Ambulatory Visit: Payer: Self-pay | Admitting: Internal Medicine

## 2024-09-21 DIAGNOSIS — E781 Pure hyperglyceridemia: Secondary | ICD-10-CM

## 2024-09-25 ENCOUNTER — Encounter: Payer: Self-pay | Admitting: Hematology & Oncology

## 2024-09-25 ENCOUNTER — Other Ambulatory Visit (HOSPITAL_COMMUNITY): Payer: Self-pay

## 2024-09-25 MED ORDER — ATORVASTATIN CALCIUM 20 MG PO TABS
20.0000 mg | ORAL_TABLET | Freq: Every day | ORAL | 3 refills | Status: AC
  Filled 2024-09-25: qty 90, 90d supply, fill #0

## 2024-09-25 NOTE — Addendum Note (Signed)
 Addended by: LORING ANDRIETTE HERO on: 09/25/2024 07:25 AM   Modules accepted: Orders

## 2024-10-06 ENCOUNTER — Other Ambulatory Visit (HOSPITAL_COMMUNITY): Payer: Self-pay

## 2024-10-14 ENCOUNTER — Encounter: Payer: Self-pay | Admitting: Hematology & Oncology

## 2024-10-14 ENCOUNTER — Other Ambulatory Visit (HOSPITAL_COMMUNITY): Payer: Self-pay

## 2024-10-14 MED ORDER — LEVOTHYROXINE SODIUM 137 MCG PO TABS
ORAL_TABLET | ORAL | 11 refills | Status: AC
  Filled 2024-10-14 (×2): qty 30, 30d supply, fill #0
  Filled 2024-12-02 (×3): qty 30, 30d supply, fill #1

## 2024-10-14 MED ORDER — LOSARTAN POTASSIUM 25 MG PO TABS
25.0000 mg | ORAL_TABLET | Freq: Every day | ORAL | 11 refills | Status: AC
  Filled 2024-10-14: qty 30, 30d supply, fill #0

## 2024-10-17 ENCOUNTER — Other Ambulatory Visit (HOSPITAL_COMMUNITY): Payer: Self-pay

## 2024-10-20 ENCOUNTER — Encounter: Payer: Self-pay | Admitting: Hematology & Oncology

## 2024-10-28 ENCOUNTER — Encounter: Payer: Self-pay | Admitting: Hematology & Oncology

## 2024-12-02 ENCOUNTER — Other Ambulatory Visit (HOSPITAL_COMMUNITY): Payer: Self-pay

## 2024-12-02 ENCOUNTER — Encounter: Payer: Self-pay | Admitting: Hematology & Oncology

## 2024-12-09 ENCOUNTER — Other Ambulatory Visit (HOSPITAL_COMMUNITY): Payer: Self-pay

## 2024-12-16 ENCOUNTER — Other Ambulatory Visit (HOSPITAL_COMMUNITY): Payer: Self-pay

## 2024-12-22 ENCOUNTER — Other Ambulatory Visit (HOSPITAL_COMMUNITY): Payer: Self-pay
